# Patient Record
Sex: Male | Born: 1965 | Race: White | Hispanic: No | Marital: Single | Smoking: Never smoker
Health system: Southern US, Community
[De-identification: ages and names within clinical notes are randomized; demographics above are authoritative.]

## PROBLEM LIST (undated history)

## (undated) DIAGNOSIS — Z800 Family history of malignant neoplasm of digestive organs: Secondary | ICD-10-CM

## (undated) DIAGNOSIS — K449 Diaphragmatic hernia without obstruction or gangrene: Secondary | ICD-10-CM

## (undated) DIAGNOSIS — F419 Anxiety disorder, unspecified: Secondary | ICD-10-CM

## (undated) DIAGNOSIS — A048 Other specified bacterial intestinal infections: Secondary | ICD-10-CM

## (undated) DIAGNOSIS — Z5189 Encounter for other specified aftercare: Secondary | ICD-10-CM

## (undated) DIAGNOSIS — K589 Irritable bowel syndrome without diarrhea: Secondary | ICD-10-CM

## (undated) HISTORY — PX: FEMUR FRACTURE SURGERY: SHX633

## (undated) HISTORY — DX: Family history of malignant neoplasm of digestive organs: Z80.0

## (undated) HISTORY — PX: ESOPHAGOGASTRODUODENOSCOPY: SHX1529

## (undated) HISTORY — DX: Other specified bacterial intestinal infections: A04.8

## (undated) HISTORY — PX: WISDOM TOOTH EXTRACTION: SHX21

## (undated) HISTORY — DX: Diaphragmatic hernia without obstruction or gangrene: K44.9

## (undated) HISTORY — DX: Anxiety disorder, unspecified: F41.9

## (undated) HISTORY — DX: Encounter for other specified aftercare: Z51.89

## (undated) HISTORY — DX: Irritable bowel syndrome, unspecified: K58.9

## (undated) HISTORY — DX: Gastro-esophageal reflux disease without esophagitis: K21.9

---

## 2000-02-03 ENCOUNTER — Emergency Department (HOSPITAL_COMMUNITY): Admission: EM | Admit: 2000-02-03 | Discharge: 2000-02-03 | Payer: Self-pay | Admitting: Emergency Medicine

## 2001-03-05 ENCOUNTER — Ambulatory Visit (HOSPITAL_COMMUNITY): Admission: RE | Admit: 2001-03-05 | Discharge: 2001-03-05 | Payer: Self-pay | Admitting: Gastroenterology

## 2002-12-29 ENCOUNTER — Encounter: Payer: Self-pay | Admitting: Gastroenterology

## 2003-01-04 ENCOUNTER — Emergency Department (HOSPITAL_COMMUNITY): Admission: EM | Admit: 2003-01-04 | Discharge: 2003-01-04 | Payer: Self-pay | Admitting: Emergency Medicine

## 2003-01-07 ENCOUNTER — Encounter: Payer: Self-pay | Admitting: Internal Medicine

## 2003-01-07 ENCOUNTER — Ambulatory Visit (HOSPITAL_COMMUNITY): Admission: RE | Admit: 2003-01-07 | Discharge: 2003-01-07 | Payer: Self-pay | Admitting: Internal Medicine

## 2003-01-13 ENCOUNTER — Encounter: Payer: Self-pay | Admitting: Internal Medicine

## 2003-01-13 ENCOUNTER — Ambulatory Visit (HOSPITAL_COMMUNITY): Admission: RE | Admit: 2003-01-13 | Discharge: 2003-01-13 | Payer: Self-pay | Admitting: Internal Medicine

## 2003-03-08 DIAGNOSIS — K449 Diaphragmatic hernia without obstruction or gangrene: Secondary | ICD-10-CM | POA: Insufficient documentation

## 2003-07-21 ENCOUNTER — Encounter: Admission: RE | Admit: 2003-07-21 | Discharge: 2003-07-21 | Payer: Self-pay | Admitting: Internal Medicine

## 2003-12-25 ENCOUNTER — Encounter: Admission: RE | Admit: 2003-12-25 | Discharge: 2003-12-25 | Payer: Self-pay | Admitting: Internal Medicine

## 2004-09-22 ENCOUNTER — Ambulatory Visit: Payer: Self-pay | Admitting: Gastroenterology

## 2004-10-10 ENCOUNTER — Ambulatory Visit: Payer: Self-pay | Admitting: Gastroenterology

## 2005-04-24 ENCOUNTER — Ambulatory Visit: Payer: Self-pay | Admitting: Gastroenterology

## 2006-08-14 ENCOUNTER — Ambulatory Visit: Payer: Self-pay | Admitting: Gastroenterology

## 2006-09-12 ENCOUNTER — Ambulatory Visit: Payer: Self-pay | Admitting: Gastroenterology

## 2007-01-13 ENCOUNTER — Ambulatory Visit: Payer: Self-pay | Admitting: Gastroenterology

## 2007-01-22 ENCOUNTER — Ambulatory Visit: Payer: Self-pay | Admitting: Gastroenterology

## 2008-02-09 ENCOUNTER — Encounter: Admission: RE | Admit: 2008-02-09 | Discharge: 2008-02-09 | Payer: Self-pay | Admitting: Occupational Medicine

## 2008-04-30 ENCOUNTER — Telehealth: Payer: Self-pay | Admitting: Gastroenterology

## 2008-05-03 ENCOUNTER — Telehealth: Payer: Self-pay | Admitting: Gastroenterology

## 2008-05-04 ENCOUNTER — Ambulatory Visit: Payer: Self-pay | Admitting: Gastroenterology

## 2008-05-04 DIAGNOSIS — R110 Nausea: Secondary | ICD-10-CM | POA: Insufficient documentation

## 2008-05-04 DIAGNOSIS — K210 Gastro-esophageal reflux disease with esophagitis: Secondary | ICD-10-CM

## 2008-05-04 DIAGNOSIS — R112 Nausea with vomiting, unspecified: Secondary | ICD-10-CM

## 2008-05-04 DIAGNOSIS — R079 Chest pain, unspecified: Secondary | ICD-10-CM

## 2008-05-04 DIAGNOSIS — R1310 Dysphagia, unspecified: Secondary | ICD-10-CM | POA: Insufficient documentation

## 2008-05-04 DIAGNOSIS — Z8719 Personal history of other diseases of the digestive system: Secondary | ICD-10-CM | POA: Insufficient documentation

## 2008-05-04 DIAGNOSIS — K219 Gastro-esophageal reflux disease without esophagitis: Secondary | ICD-10-CM

## 2008-05-05 ENCOUNTER — Encounter: Payer: Self-pay | Admitting: Gastroenterology

## 2008-05-05 ENCOUNTER — Other Ambulatory Visit: Admission: RE | Admit: 2008-05-05 | Discharge: 2008-05-05 | Payer: Self-pay | Admitting: Gastroenterology

## 2008-05-05 ENCOUNTER — Ambulatory Visit: Payer: Self-pay | Admitting: Gastroenterology

## 2008-05-07 ENCOUNTER — Encounter: Payer: Self-pay | Admitting: Gastroenterology

## 2008-12-01 ENCOUNTER — Encounter: Admission: RE | Admit: 2008-12-01 | Discharge: 2008-12-01 | Payer: Self-pay | Admitting: Internal Medicine

## 2009-11-12 ENCOUNTER — Emergency Department (HOSPITAL_COMMUNITY): Admission: EM | Admit: 2009-11-12 | Discharge: 2009-11-12 | Payer: Self-pay | Admitting: Emergency Medicine

## 2009-12-14 ENCOUNTER — Encounter: Admission: RE | Admit: 2009-12-14 | Discharge: 2009-12-14 | Payer: Self-pay | Admitting: Occupational Medicine

## 2010-02-01 ENCOUNTER — Encounter (INDEPENDENT_AMBULATORY_CARE_PROVIDER_SITE_OTHER): Payer: Self-pay | Admitting: *Deleted

## 2010-02-07 ENCOUNTER — Encounter (INDEPENDENT_AMBULATORY_CARE_PROVIDER_SITE_OTHER): Payer: Self-pay | Admitting: *Deleted

## 2010-02-08 ENCOUNTER — Encounter (INDEPENDENT_AMBULATORY_CARE_PROVIDER_SITE_OTHER): Payer: Self-pay | Admitting: *Deleted

## 2010-02-10 ENCOUNTER — Ambulatory Visit: Payer: Self-pay | Admitting: Gastroenterology

## 2010-02-19 ENCOUNTER — Telehealth: Payer: Self-pay | Admitting: Gastroenterology

## 2010-02-20 ENCOUNTER — Ambulatory Visit: Payer: Self-pay | Admitting: Gastroenterology

## 2010-02-20 ENCOUNTER — Telehealth: Payer: Self-pay | Admitting: Gastroenterology

## 2010-02-20 ENCOUNTER — Encounter (INDEPENDENT_AMBULATORY_CARE_PROVIDER_SITE_OTHER): Payer: Self-pay | Admitting: *Deleted

## 2010-02-28 ENCOUNTER — Telehealth: Payer: Self-pay | Admitting: Gastroenterology

## 2010-04-13 ENCOUNTER — Ambulatory Visit: Payer: Self-pay | Admitting: Gastroenterology

## 2010-04-14 ENCOUNTER — Telehealth: Payer: Self-pay | Admitting: Gastroenterology

## 2010-04-14 ENCOUNTER — Encounter (INDEPENDENT_AMBULATORY_CARE_PROVIDER_SITE_OTHER): Payer: Self-pay | Admitting: *Deleted

## 2010-04-14 LAB — CONVERTED CEMR LAB
AST: 28 units/L (ref 0–37)
Albumin: 3.9 g/dL (ref 3.5–5.2)
BUN: 13 mg/dL (ref 6–23)
Basophils Relative: 1 % (ref 0.0–3.0)
Bilirubin Urine: NEGATIVE
Eosinophils Relative: 2.3 % (ref 0.0–5.0)
GFR calc non Af Amer: 72.36 mL/min (ref >60)
Glucose, Bld: 82 mg/dL (ref 70–99)
HCT: 40.7 % (ref 39.0–52.0)
Hemoglobin, Urine: NEGATIVE
Ketones, ur: NEGATIVE mg/dL
Lymphs Abs: 0.9 10*3/uL (ref 0.7–4.0)
MCV: 91 fL (ref 78.0–100.0)
Monocytes Absolute: 0.3 10*3/uL (ref 0.1–1.0)
Neutrophils Relative %: 50 % (ref 43.0–77.0)
Potassium: 4.4 meq/L (ref 3.5–5.1)
RBC: 4.47 M/uL (ref 4.22–5.81)
TSH: 0.72 microintl units/mL (ref 0.35–5.50)
Total Protein, Urine: NEGATIVE mg/dL
Total Protein: 6.3 g/dL (ref 6.0–8.3)
Urine Glucose: NEGATIVE mg/dL
WBC: 2.6 10*3/uL — ABNORMAL LOW (ref 4.5–10.5)

## 2010-05-24 ENCOUNTER — Encounter: Admission: RE | Admit: 2010-05-24 | Discharge: 2010-05-24 | Payer: Self-pay | Admitting: Occupational Medicine

## 2010-05-31 ENCOUNTER — Encounter (INDEPENDENT_AMBULATORY_CARE_PROVIDER_SITE_OTHER): Payer: Self-pay | Admitting: *Deleted

## 2010-06-02 ENCOUNTER — Encounter (INDEPENDENT_AMBULATORY_CARE_PROVIDER_SITE_OTHER): Payer: Self-pay | Admitting: *Deleted

## 2010-06-08 ENCOUNTER — Encounter: Payer: Self-pay | Admitting: Sports Medicine

## 2010-06-29 ENCOUNTER — Encounter: Payer: Self-pay | Admitting: Sports Medicine

## 2010-06-29 ENCOUNTER — Ambulatory Visit
Admission: RE | Admit: 2010-06-29 | Discharge: 2010-06-29 | Payer: Self-pay | Source: Home / Self Care | Attending: Sports Medicine | Admitting: Sports Medicine

## 2010-06-29 DIAGNOSIS — M7520 Bicipital tendinitis, unspecified shoulder: Secondary | ICD-10-CM | POA: Insufficient documentation

## 2010-06-29 DIAGNOSIS — M25519 Pain in unspecified shoulder: Secondary | ICD-10-CM | POA: Insufficient documentation

## 2010-07-11 ENCOUNTER — Ambulatory Visit: Admit: 2010-07-11 | Payer: Self-pay | Admitting: Gastroenterology

## 2010-07-11 ENCOUNTER — Encounter (INDEPENDENT_AMBULATORY_CARE_PROVIDER_SITE_OTHER): Payer: Self-pay | Admitting: *Deleted

## 2010-07-13 ENCOUNTER — Ambulatory Visit: Admit: 2010-07-13 | Payer: Self-pay | Admitting: Sports Medicine

## 2010-07-18 ENCOUNTER — Ambulatory Visit
Admission: RE | Admit: 2010-07-18 | Discharge: 2010-07-18 | Payer: Self-pay | Source: Home / Self Care | Attending: Sports Medicine | Admitting: Sports Medicine

## 2010-07-25 NOTE — Procedures (Signed)
Summary: Colonoscopy   Colonoscopy  Procedure date:  01/22/2007  Findings:      Location:  [facility].   Patient Name: Nicholas Townsend, Nicholas Townsend MRN: [id number] Procedure Procedures: Colonoscopy CPT: [phone].  Personnel: Endoscopist: Vania Rea. Jarold Motto, MD.  Exam Location: Exam performed in Outpatient Clinic. Outpatient  Patient Consent: Procedure, Alternatives, Risks and Benefits discussed, consent obtained, from patient. Consent was obtained by the RN.  Indications  Increased Risk Screening: For family history of colorectal neoplasia, in  parent  History  Current Medications: Patient is not currently taking Coumadin.  Pre-Exam Physical: Performed Jan 22, 2007. Cardio-pulmonary exam, Abdominal exam, Extremity exam, Mental status exam WNL.  Comments: Pt. history reviewed/updated, physical exam performed prior to initiation of sedation? yes Exam Exam: Extent of exam reached: Cecum, extent intended: Cecum.  The cecum was identified by appendiceal orifice and IC valve. Patient position: on left side. Time to Cecum: 00:04:07. Time for Withdrawl: 00:07:17. Colon retroflexion performed. Images taken. ASA Classification: I. Tolerance: excellent.  Monitoring: Pulse and BP monitoring, Oximetry used. Supplemental O2 given. at 2 Liters.  Colon Prep Used Golytely for colon prep. Prep results: poor.  Sedation Meds: Patient assessed and found to be appropriate for moderate (conscious) sedation. Sedation was managed by the Endoscopist. Monitored Anesthesia Care. Fentanyl 100 mcg. given IV. Versed 10 mg. given IV.  Instrument(s): CF 140L. Serial [id number].  Findings - NORMAL EXAM: Cecum to Rectum. Not Seen: Polyps. AVM's. Colitis. Tumors. Melanosis. Crohn's. Diverticulosis. Hemorrhoids. Comments: VERY POOR PREP...incomplete exam...   Assessment Normal examination.  Comments: Very poor prep....repeat exam 3 years... Events  Unplanned Interventions: No  intervention was required.  Plans Medication Plan: Referring provider to order medications.  Disposition: After procedure patient sent to recovery. After recovery patient sent home.  Scheduling/Referral: Follow-Up prn.    CC: Rosalyn Gess. Norins, MD  This report was created from the original endoscopy report, which was reviewed and signed by the above listed endoscopist.

## 2010-07-25 NOTE — Progress Notes (Signed)
Summary: Prep made him sick  Phone Note Call from Patient Call back at Home Phone [phone] Call back at [phone]   Caller: Patient Call For: Dr. Jarold Motto Reason for Call: Talk to Nurse Summary of Call: Pt spoke to on-call DR last night, prep made him very sick and he was not able to complete.  Procedure scheduled for 11am.  Please call hm px or cell [phone] Initial call taken by: Misty Stanley,  February 20, 2010 8:27 AM  Follow-up for Phone Call        returned pt.'s call. Pt c/o vomiting last night after trying to do movi prep . unable to keep any of prep down, called Dr. Russella Dar (Dr. on call) . Pt. took nausea pill Dr. Russella Dar perscribed and attempted to drink Mag. Citrate as Dr Russella Dar instructed him to do ,but unable to keep this down . Pt attempted to drink 2nd part of movi prep this am  but immed. started to vomit again. Dr.Patterson notified of this . Pt cancelled for today and to be set up to come in for an office visit w/ Dr. Jarold Motto to discuss other options. Pt. notified. Follow-up by: Jamie Brookes      Appended Document: Prep made him sick pt sch'ed for October 20th @ 9:30AM

## 2010-07-25 NOTE — Letter (Signed)
Summary: Colonoscopy Letter  [facility]  [address] [place], [place] [address]   Phone: [phone]  Fax: [phone]      February 01, 2010 MRN: [id number]   [facility] [place] [address] [place], [place]  [address]   Dear Nicholas Townsend,   According to your medical record, it is time for you to schedule a Colonoscopy. The American Cancer Society recommends this procedure as a method to detect early colon cancer. Patients with a family history of colon cancer, or a personal history of colon polyps or inflammatory bowel disease are at increased risk.  This letter has beeen generated based on the recommendations made at the time of your procedure. If you feel that in your particular situation this may no longer apply, please contact our office.  Please call our office at [phone] to schedule this appointment or to update your records at your earliest convenience.  Thank you for cooperating with [place] to provide you with the very best care possible.   Sincerely,   Vania Rea. Jarold Motto, M.D.  [facility] Gastroenterology Division [phone]

## 2010-07-25 NOTE — Assessment & Plan Note (Signed)
Summary: OV BEFORE COL, UNABLE TO COMPLETE PREP...AS.   History of Present Illness Visit Type: Follow-up Visit Primary GI MD: Sheryn Bison MD Faith Rogue Primary Jayde Mcallister: [facility] Chief Complaint: Patient here to discuss having a colonoscopy. He was unable to complete the Moviprep (caused vomiting, stomach cramps). Patient does have some burning abdominal pain at times. History of Present Illness:   45 year old Caucasian male with a very strong family history of colon cancer in his father at age 85. He has had several colonoscopies, recently with the scheduled for colonoscopy but was unable to complete the polyethylene glycol prep. He denies GI complaints at this time. There is some vague history of possible elevated serum creatinine related to amino acid preparations that he takes for fitness supplementation. I do not have these labs for review.   GI Review of Systems    Reports acid reflux, belching, and  bloating.      Denies abdominal pain, chest pain, dysphagia with liquids, dysphagia with solids, heartburn, loss of appetite, nausea, vomiting, vomiting blood, weight loss, and  weight gain.        Denies anal fissure, black tarry stools, change in bowel habit, constipation, diarrhea, diverticulosis, fecal incontinence, heme positive stool, hemorrhoids, irritable bowel syndrome, jaundice, light color stool, liver problems, rectal bleeding, and  rectal pain. Preventive Screening-Counseling & Management  Caffeine-Diet-Exercise     Does Patient Exercise: yes      Drug Use:  no.      Current Medications (verified): 1)  Aciphex 20 Mg Tbec (Rabeprazole Sodium) .... Two Times A Day (Out of Medication!) 2)  Fish Oil 1000 Mg Caps (Omega-3 Fatty Acids) .... Take 2 Capsules By Mouth Once Daily 3)  Vitamin C 1000 Mg Tabs (Ascorbic Acid) .... Take 2 Tablets By Mouth Once Daily 4)  Eql Vitamin C 500 Mg Tabs (Ascorbic Acid) .... Take 1 Tablet By Mouth Once A Day 5)  Cla 2000 Mg  Tablet .... Take 1 Tablet By Mouth Once A Day 6)  Elglucomen 2000 Tablet .... Take 1 Tablet By Mouth Once A Day 7)  Ativan 0.5 Mg Tabs (Lorazepam) .... Take 1 Tablet By Mouth At Bedtime As Needed  Allergies: 1)  Codeine Phosphate (Codeine Phosphate)  Past History:  Past medical, surgical, family and social histories (including risk factors) reviewed for relevance to current acute and chronic problems.  Past Medical History: Reviewed history from 05/04/2008 and no changes required. Current Problems:  CARCINOMA, COLON, FAMILY HX (ICD-V16.0) IRRITABLE BOWEL SYNDROME, HX OF (ICD-V12.79) GERD (ICD-530.81) HIATAL HERNIA (ICD-553.3)  Past Surgical History: Fractured rt femur/ surgery Right elbow surgery  Family History: Reviewed history from 05/04/2008 and no changes required. Family History of Breast Cancer:Sister Family History of Colon Cancer: Father Family History of Pancreatic Cancer: Father  Family History of Diabetes: 3 uncles  Social History: Reviewed history from 05/04/2008 and no changes required. 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  Alcohol Use - yes-social  Illicit Drug Use - no Patient gets regular exercise. Drug Use:  no Does Patient Exercise:  yes  Review of Systems       The patient complains of back pain and sleeping problems.  The patient denies allergy/sinus, anemia, anxiety-new, arthritis/joint pain, blood in urine, breast changes/lumps, change in vision, confusion, cough, coughing up blood, depression-new, fainting, fatigue, fever, headaches-new, hearing problems, heart murmur, itching, menstrual pain, muscle pains/cramps, night sweats, nosebleeds, pregnancy symptoms, shortness of breath, skin rash, sore throat, swelling of feet/legs, swollen lymph glands, thirst - excessive ,  urination - excessive , urination changes/pain, urine leakage, vision changes, and voice change.    Vital Signs:  Patient profile:   45 year old male Height:      69  inches Weight:      168.38 pounds BMI:     24.96 BSA:     1.92 Pulse rate:   64 / minute Pulse rhythm:   regular BP sitting:   124 / 76  (right arm)  Vitals Entered By: Lamona Curl CMA Duncan Dull) (April 13, 2010 9:32 AM)  Physical Exam  General:  Well developed, well nourished, no acute distress.healthy appearing.   Head:  Normocephalic and atraumatic. Eyes:  PERRLA, no icterus.exam deferred to patient's ophthalmologist.   Psych:  Alert and cooperative. Normal mood and affect.   Impression & Recommendations:  Problem # 1:  CARCINOMA, COLON, FAMILY HX (ICD-V16.0) Assessment Unchanged  We Will Check labs including serum creatinine, also urinalysis. These are unremarkable I think he could do the Phospho-Soda pill bowel prep without any significant risk for his lack of other comorbid medical problems and his young age. Advice will be given to him pending these evaluations. I do think is important that he continue with close colonoscopy followup. He denies upper GI or other gastrointestinal symptomatology at this time. 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  Orders: TLB-Udip w/ Micro (81001-URINE) TLB-CBC Platelet - w/Differential (85025-CBCD) TLB-BMP (Basic Metabolic Panel-BMET) (80048-METABOL) TLB-Hepatic/Liver Function Pnl (80076-HEPATIC) TLB-TSH (Thyroid Stimulating Hormone) (13086-VHQ)  Patient Instructions: 1)  Copy sent to : [facility] 2)  Please continue current medications.  3)  Please go to the basement today for your labs.  4)  We will call you when your labs come back to advise if you can take the OsmoPrep. 5)  The medication list was reviewed and reconciled.  All changed / newly prescribed medications were explained.  A complete medication list was provided to the patient / caregiver. Prescriptions: ACIPHEX 20 MG TBEC (RABEPRAZOLE SODIUM) two times a day  #60 x 3   Entered by:   Harlow Mares CMA (AAMA)   Authorized by:   Mardella Layman MD [facility]   Signed by:   Harlow Mares CMA (AAMA) on 04/13/2010   Method used:   [facility] to        [facility] [phone]* (retail)       [address]       [place], [place]  [address]       Ph: [phone] or [phone]       Fax: [phone]   RxID:   [id number]

## 2010-07-25 NOTE — Progress Notes (Signed)
----   Converted from flag ---- ---- 02/20/2010 9:20 AM, Alden Hipp wrote: Per Dr Jarold Motto, charge pt for cancelled appt. ------------------------------  Phone Note Outgoing Call   Summary of Call: Patient BILLED. Leanor Kail [facility]  February 28, 2010 2:09 PM

## 2010-07-25 NOTE — Miscellaneous (Signed)
Summary: previsit prep/rm  Clinical Lists Changes  Medications: Added new medication of MOVIPREP 100 GM  SOLR (PEG-KCL-NACL-NASULF-NA ASC-C) As per prep instructions. - Signed Rx of MOVIPREP 100 GM  SOLR (PEG-KCL-NACL-NASULF-NA ASC-C) As per prep instructions.;  #1 x 0;  Signed;  Entered by: Sherren Kerns RN;  Authorized by: Mardella Layman MD [facility];  Method used: [facility] [phone]*, [address] [place], [place], [place]  [address], Ph: [phone] or [phone], Fax: [phone] Observations: Added new observation of ALLERGY REV: Done (02/10/2010 10:49)    Prescriptions: MOVIPREP 100 GM  SOLR (PEG-KCL-NACL-NASULF-NA ASC-C) As per prep instructions.  #1 x 0   Entered by:   Sherren Kerns RN   Authorized by:   Mardella Layman MD [facility]   Signed by:   Sherren Kerns RN on 02/10/2010   Method used:   Electronically to        [facility] [phone]* (retail)       [address]       [place], [place]  [address]       Ph: [phone] or [phone]       Fax: [phone]   RxID:   [id number]

## 2010-07-25 NOTE — Letter (Signed)
Summary: Pre Visit No Show Letter  [facility]  [address] [place], [place] [address]   Phone: [phone]  Fax: [phone]        May 31, 2010 MRN: [id number]    [facility] [place] [address] [place], [place]  [address]    Dear Mr. Coughlin,   We have been unable to reach you by phone concerning the pre-procedure visit that you missed on Monday 05/29/2010. For this reason,your procedure scheduled on Monday 06/12/2010 has been cancelled. Our scheduling staff will gladly assist you with rescheduling your appointments at a more convenient time. Please call our office at [phone] between the hours of 8:00am and 5:00pm, press option #2 to reach an appointment scheduler. Please consider updating your contact numbers at this time so that we can reach you by phone in the future with schedule changes or results.    Thank you,    Ezra Sites RN [facility]

## 2010-07-25 NOTE — Progress Notes (Signed)
Summary: results request  Phone Note Call from Patient Call back at [phone]   Caller: Patient Call For: Dr. Jarold Motto Reason for Call: Talk to Nurse Summary of Call: would like labwork results... this he may have been called yesterday afternoon with results, but no message left Initial call taken by: Vallarie Mare,  April 14, 2010 9:17 AM  Follow-up for Phone Call        pt aware ok to do osmo prep but he will have to sign the consent. Neysa Bonito will schedule him . Follow-up by: Harlow Mares CMA Duncan Dull),  April 14, 2010 9:25 AM

## 2010-07-25 NOTE — Letter (Signed)
Summary: New Patient letter  [facility]  [address] [place], [place] [address]   Phone: [phone]  Fax: [phone]       02/20/2010 MRN: [id number]  [facility] Nicholas Townsend [address] [place], [place]  [address]  Dear Nicholas Townsend,  Welcome to the Gastroenterology Division at [facility].    You are scheduled to see Dr. Jarold Motto on 04/13/2010 at 9:30AM on the 3rd floor at [facility], [address].  We ask that you try to arrive at our office 15 minutes prior to your appointment time to allow for check-in.  We would like you to complete the enclosed self-administered evaluation form prior to your visit and bring it with you on the day of your appointment.  We will review it with you.  Also, please bring a complete list of all your medications or, if you prefer, bring the medication bottles and we will list them.  Please bring your insurance card so that we may make a copy of it.  If your insurance requires a referral to see a specialist, please bring your referral form from your primary care physician.  Co-payments are due at the time of your visit and may be paid by cash, check or credit card.     Your office visit will consist of a consult with your physician (includes a physical exam), any laboratory testing he/she may order, scheduling of any necessary diagnostic testing (e.g. x-ray, ultrasound, CT-scan), and scheduling of a procedure (e.g. Endoscopy, Colonoscopy) if required.  Please allow enough time on your schedule to allow for any/all of these possibilities.    If you cannot keep your appointment, please call [phone] to cancel or reschedule prior to your appointment date.  This allows [place] the opportunity to schedule an appointment for another patient in need of care.  If you do not cancel or reschedule by 5 p.m. the business day prior to your appointment date, you will be charged a $50.00 late cancellation/no-show fee.    Thank you for  choosing [facility] for your medical needs.  We appreciate the opportunity to care for you.  Please visit [place] at our website  to learn more about our practice.                     Sincerely,                                                             The Gastroenterology Division

## 2010-07-25 NOTE — Letter (Signed)
Summary: Pre Visit Letter Revised  [facility]  [address] [place], [place] [address]   Phone: [phone]  Fax: [phone]        06/02/2010 MRN: [id number]   [facility] [place] [address] [place], [place]  [address]   Procedure Date:  July 24, 2010  Welcome to the Gastroenterology Division at [facility].    You are scheduled to see a nurse for your pre-procedure visit on 07/11/10 at 9:00 A.M. on the 3rd floor at [facility], [address].  We ask that you try to arrive at our office 15 minutes prior to your appointment time to allow for check-in.  Please take a minute to review the attached form.  If you answer "Yes" to one or more of the questions on the first page, we ask that you call the person listed at your earliest opportunity.  If you answer "No" to all of the questions, please complete the rest of the form and bring it to your appointment.    Your nurse visit will consist of discussing your medical and surgical history, your immediate family medical history, and your medications.   If you are unable to list all of your medications on the form, please bring the medication bottles to your appointment and we will list them.  We will need to be aware of both prescribed and over the counter drugs.  We will need to know exact dosage information as well.    Please be prepared to read and sign documents such as consent forms, a financial agreement, and acknowledgement forms.  If necessary, and with your consent, a friend or relative is welcome to sit-in on the nurse visit with you.  Please bring your insurance card so that we may make a copy of it.  If your insurance requires a referral to see a specialist, please bring your referral form from your primary care physician.  No co-pay is required for this nurse visit.     If you cannot keep your appointment, please call [phone] to cancel or reschedule prior to your appointment date.  This  allows [place] the opportunity to schedule an appointment for another patient in need of care.    Thank you for choosing [facility] for your medical needs.  We appreciate the opportunity to care for you.  Please visit [place] at our website  to learn more about our practice.  Sincerely, The Gastroenterology Division

## 2010-07-25 NOTE — Letter (Signed)
Summary: Pre Visit Letter Revised  [facility]  [address] [place], [place] [address]   Phone: [phone]  Fax: [phone]    02/07/2010 MRN: [id number]  [facility] [place] [address] [place], [place]  [address]              Procedure Date: 02/20/2010    Welcome to the Gastroenterology Division at [facility].    You are scheduled to see a nurse for your pre-procedure visit on 02/10/2010 at 11:00AM on the 3rd floor at [facility], [address].  We ask that you try to arrive at our office 15 minutes prior to your appointment time to allow for check-in.  Please take a minute to review the attached form.  If you answer "Yes" to one or more of the questions on the first page, we ask that you call the person listed at your earliest opportunity.  If you answer "No" to all of the questions, please complete the rest of the form and bring it to your appointment.    Your nurse visit will consist of discussing your medical and surgical history, your immediate family medical history, and your medications.    If you are unable to list all of your medications on the form, please bring the medication bottles to your appointment and we will list them.  We will need to be aware of both prescribed and over the counter drugs.  We will need to know exact dosage information as well.    Please be prepared to read and sign documents such as consent forms, a financial agreement, and acknowledgement forms.  If necessary, and with your consent, a friend or relative is welcome to sit-in on the nurse visit with you.  Please bring your insurance card so that we may make a copy of it.  If your insurance requires a referral to see a specialist, please bring your referral form from your primary care physician.  No co-pay is required for this nurse visit.     If you cannot keep your appointment, please call [phone] to cancel or reschedule prior to your appointment date.  This allows  [place] the opportunity to schedule an appointment for another patient in need of care.   Thank you for choosing [facility] for your medical needs.  We appreciate the opportunity to care for you.  Please visit [place] at our website  to learn more about our practice.                     Sincerely,  The Gastroenterology Division

## 2010-07-25 NOTE — Letter (Signed)
Summary: Pre Visit Letter Revised  [facility]  [address] [place], [place] [address]   Phone: [phone]  Fax: [phone]        04/14/2010 MRN: [id number] [facility] [place] [address] [place], [place]  [address]             Procedure Date:  06-12-10   Welcome to the Gastroenterology Division at [facility].    You are scheduled to see a nurse for your pre-procedure visit on 05-29-10 at 9:00a.m. on the 3rd floor at [facility], [address].  We ask that you try to arrive at our office 15 minutes prior to your appointment time to allow for check-in.  Please take a minute to review the attached form.  If you answer "Yes" to one or more of the questions on the first page, we ask that you call the person listed at your earliest opportunity.  If you answer "No" to all of the questions, please complete the rest of the form and bring it to your appointment.    Your nurse visit will consist of discussing your medical and surgical history, your immediate family medical history, and your medications.   If you are unable to list all of your medications on the form, please bring the medication bottles to your appointment and we will list them.  We will need to be aware of both prescribed and over the counter drugs.  We will need to know exact dosage information as well.    Please be prepared to read and sign documents such as consent forms, a financial agreement, and acknowledgement forms.  If necessary, and with your consent, a friend or relative is welcome to sit-in on the nurse visit with you.  Please bring your insurance card so that we may make a copy of it.  If your insurance requires a referral to see a specialist, please bring your referral form from your primary care physician.  No co-pay is required for this nurse visit.     If you cannot keep your appointment, please call [phone] to cancel or reschedule prior to your appointment date.  This allows  [place] the opportunity to schedule an appointment for another patient in need of care.    Thank you for choosing [facility] for your medical needs.  We appreciate the opportunity to care for you.  Please visit [place] at our website  to learn more about our practice.  Sincerely, The Gastroenterology Division

## 2010-07-25 NOTE — Progress Notes (Signed)
  Phone Note Call from Patient   Caller: Patient Summary of Call: On call note @ 1720. Pt taking MoviPrep for colonoscopy tomorrow and vomited about 1/2 of 1st liter. He had the same problem when he had a bowel prep a few years ago. Has had a mild HA all day. OK to take Tylenol for HA. Advised to take metoclopramide 10mg  before repeat prep tonight and again in am. Take 2  bottles of mag citrate 45 minutes after metoclopramide and then try to complete the rest of the 1st liter of MoviPrep. If he cannot tolerate MoviPrep tonight just use 2 bottles of mag citrate in am. If he can tolerate MoviPrep after metoclopramide tonight the try the liter in am and if not successful use the mag citrate. Initial call taken by: Meryl Dare MD Clementeen Graham,  February 19, 2010 5:29 PM

## 2010-07-25 NOTE — Letter (Signed)
Summary: [facility] Instructions  [facility]  [address] [place], [place] [address]   Phone: [phone]  Fax: [phone]       Nicholas Townsend    02/01/1966    MRN: [id number]        Procedure Day /Date:  Monday 02/20/2010     Arrival Time: 10:00 am      Procedure Time: 11:00 am     Location of Procedure:                    _ x_  [facility] (4th Floor)                        PREPARATION FOR COLONOSCOPY WITH MOVIPREP   Starting 5 days prior to your procedure Wednesday 8/24 do not eat nuts, seeds, popcorn, corn, beans, peas,  salads, or any raw vegetables.  Do not take any fiber supplements (e.g. Metamucil, Citrucel, and Benefiber).  THE DAY BEFORE YOUR PROCEDURE         DATE: Sunday 8/28  1.  Drink clear liquids the entire day-NO SOLID FOOD  2.  Do not drink anything colored red or purple.  Avoid juices with pulp.  No orange juice.  3.  Drink at least 64 oz. (8 glasses) of fluid/clear liquids during the day to prevent dehydration and help the prep work efficiently.  CLEAR LIQUIDS INCLUDE: Water Jello Ice Popsicles Tea (sugar ok, no milk/cream) Powdered fruit flavored drinks Coffee (sugar ok, no milk/cream) Gatorade Juice: apple, white grape, white cranberry  Lemonade Clear bullion, consomm, broth Carbonated beverages (any kind) Strained chicken noodle soup Hard Candy                             4.  In the morning, mix first dose of MoviPrep solution:    Empty 1 Pouch A and 1 Pouch B into the disposable container    Add lukewarm drinking water to the top line of the container. Mix to dissolve    Refrigerate (mixed solution should be used within 24 hrs)  5.  Begin drinking the prep at 5:00 p.m. The MoviPrep container is divided by 4 marks.   Every 15 minutes drink the solution down to the next mark (approximately 8 oz) until the full liter is complete.   6.  Follow completed prep with 16 oz of clear liquid of your choice  (Nothing red or purple).  Continue to drink clear liquids until bedtime.  7.  Before going to bed, mix second dose of MoviPrep solution:    Empty 1 Pouch A and 1 Pouch B into the disposable container    Add lukewarm drinking water to the top line of the container. Mix to dissolve    Refrigerate  THE DAY OF YOUR PROCEDURE      DATE: Monday 8/29  Beginning at 6:00 a.m. (5 hours before procedure):         1. Every 15 minutes, drink the solution down to the next mark (approx 8 oz) until the full liter is complete.  2. Follow completed prep with 16 oz. of clear liquid of your choice.    3. You may drink clear liquids until 9:00 am (2 HOURS BEFORE PROCEDURE).   MEDICATION INSTRUCTIONS  Unless otherwise instructed, you should take regular prescription medications with a small sip of water   as early as possible the morning of  your procedure.   Additional medication instructions: n/a         OTHER INSTRUCTIONS  You will need a responsible adult at least 46 years of age to accompany you and drive you home.   This person must remain in the waiting room during your procedure.  Wear loose fitting clothing that is easily removed.  Leave jewelry and other valuables at home.  However, you may wish to bring a book to read or  an iPod/MP3 player to listen to music as you wait for your procedure to start.  Remove all body piercing jewelry and leave at home.  Total time from sign-in until discharge is approximately 2-3 hours.  You should go home directly after your procedure and rest.  You can resume normal activities the  day after your procedure.  The day of your procedure you should not:   Drive   Make legal decisions   Operate machinery   Drink alcohol   Return to work  You will receive specific instructions about eating, activities and medications before you leave.    The above instructions have been reviewed and explained to me by   Sherren Kerns RN  February 10, 2010 11:36 AM     I fully understand and can verbalize these instructions _____________________________ Date _________

## 2010-07-27 NOTE — Letter (Signed)
Summary: *Consult Note  [facility]  [address]   [place], [place] [address]   Phone: [phone]  Fax: [phone]    Re:    Nicholas Townsend DOB:    11/17/1965 Kathrine Haddock, MD Occupational Health Director [facility] 06/29/10    Dear Grayland Ormond:    Thank you for requesting that we see the above patient for consultation.  A copy of the detailed office note will be sent under separate cover, for your review.  Evaluation today is consistent with:  1)  BICEPS TENDINITIS, LEFT (ICD-726.12) 2)  SHOULDER PAIN, LEFT (ICD-719.41)  Our recommendation is for: Continued conservative care.  Note this is a tear of stabilizing ligament that holds bicipital tendon in place based on [place].  The tendon is not torn and there is no rotator cuff tear.  If not improving significantly in 2 weeks I think we should try NTG patches.  Would limit shoulder flexion and  lifting overhead for next 6 weeks.  Rescan in 6 weeks.   New Orders include:  1)  Consultation Level II [99242] 2)  [place] LIMITED [[id number]]   New Medications started today include: none   After today's visit, the patients current medications include: 1)  ACIPHEX 20 MG TBEC (RABEPRAZOLE SODIUM) two times a day 2)  FISH OIL 1000 MG CAPS (OMEGA-3 FATTY ACIDS) Take 2 capsules by mouth once daily 3)  VITAMIN C 1000 MG TABS (ASCORBIC ACID) Take 2 tablets by mouth once daily 4)  EQL VITAMIN C 500 MG TABS (ASCORBIC ACID) Take 1 tablet by mouth once a day 5)  * CLA 2000 MG TABLET Take 1 tablet by mouth once a day 6)  * ELGLUCOMEN 2000 TABLET Take 1 tablet by mouth once a day 7)  ATIVAN 0.5 MG TABS (LORAZEPAM) Take 1 tablet by mouth at bedtime as needed   Thank you for this consultation.  If you have any further questions regarding the care of this patient, please do not hesitate to contact me @ [phone].  Thank you for this opportunity to look after your patient.  Sincerely,  Army Melia Ailana Cuadrado  MD  Appended Document: Orders Update    Clinical Lists Changes  Medications: Added new medication of TRAMADOL HCL 50 MG TABS (TRAMADOL HCL) 1 by mouth q6hours as needed pain - Signed Rx of TRAMADOL HCL 50 MG TABS (TRAMADOL HCL) 1 by mouth q6hours as needed pain;  #50 x 1;  Signed;  Entered by: Enid Baas MD;  Authorized by: Enid Baas MD;  Method used: Electronically to [facility] [address], [place], [place], [place]  [address], Ph: [phone], Fax: [phone]  Having increasing shoulder pain after exam yesterday.  discussed trial of tramadol.  no codeine allergy - just gets nausea so will try this.  Prescriptions: TRAMADOL HCL 50 MG TABS (TRAMADOL HCL) 1 by mouth q6hours as needed pain  #50 x 1   Entered and Authorized by:   Enid Baas MD   Signed by:   Enid Baas MD on 06/30/2010   Method used:   Electronically to        [facility] (retail)       [address]       [place], [place]  [address]       Ph: [phone]       Fax: [phone]   RxID:  [id number]   

## 2010-07-27 NOTE — Assessment & Plan Note (Signed)
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  Alcohol-Tobacco     Smoking Status: never  Allergies: 1)  Codeine Phosphate (Codeine Phosphate)  Physical Exam  General:  Well-developed,well-nourished,in no acute distress; alert,appropriate and cooperative throughout examination Msk:  muscular with excellent definition of shoulders bilat  + speeds + yergason's Neg Neers, empty can and Hawkings strong on supraspinatus , infraspinatus testing  mildy weak on IR testing However push off test very weak on left 2/2 pain  Labral test + for ant pain/ w augmentation pain resolves Additional Exam:  MSK [place] Increased fluid in bicipital groove BT appears intact not dislocated from groove However superior grrove shoes tear of stabilizing ligament  Supraspinatus, infraspinatus intact -no impingement seen bursa normal subscapularis intact - no internal impingement  POst labrum is intact Ant labrum  is irregular but difficult to visualize by US   Impression & Recommendations:  Problem # 1:  SHOULDER PAIN, LEFT (ICD-719.41)  This really seems primarily related to bicipital tendon  difficult to assess ant labrum but if not improving labral tear needs to be ruled out  no RC tear seen  Orders: [place] LIMITED ([id number])  Problem # 2:  BICEPS TENDINITIS, LEFT (ICD-726.12)  Not  much tendon swelling noted However stabilizing ligament is torn and I think this leads to bicipital subluxation with lifting  cont to avoid any lifting that creates pain if not making steady progress in 2 weeks consider adding NTG  may take an additional 6 wks to heal conservatively would rescan at that time  Orders: [place] LIMITED ([id number])  Complete Medication List: 1)  Aciphex 20 Mg Tbec (Rabeprazole sodium) .... Two times a day 2)  Fish Oil 1000 Mg Caps (Omega-3 fatty acids) .... Take 2 capsules by mouth once daily 3)  Vitamin C 1000 Mg Tabs (Ascorbic acid) .... Take 2 tablets by mouth once daily 4)  Eql Vitamin C 500 Mg Tabs (Ascorbic acid) .... Take 1 tablet by mouth once a day 5)  Cla 2000 Mg Tablet  .... Take 1 tablet by mouth once a day 6)  Elglucomen 2000 Tablet  .... Take 1 tablet by mouth once a day 7)  Ativan 0.5 Mg Tabs (Lorazepam) .... Take 1 tablet by mouth at bedtime as needed   Orders Added: 1)  Consultation Level II [99242] 2)  [place] LIMITED [[id number]]

## 2010-07-27 NOTE — Assessment & Plan Note (Signed)
Summary: W/C L SHOULDER ,MC   Vital Signs:  Patient profile:   45 year old male Height:      69 inches Weight:      172 pounds Pulse rate:   64 / minute BP sitting:   124 / 75  (right arm)  Vitals Entered By: Enid Baas MD (July 18, 2010 9:23 AM) CC: f/u L shoulder   [facility] Provider:  [facility]  CC:  f/u L shoulder.  History of Present Illness: Pt here to f/u W/C L shoulder pain, bicep tendonitis.  Less tenderness now.  Still hurt with opening doors, and with lifting motion.  Has not been doing any lifting that stresses biceps  on last visit had some tearing of retinaculum over bicepts tendon on [place] but no RC tear noted  while improved he would like to see if he can make faster progress  Preventive Screening-Counseling & Management  Alcohol-Tobacco     Smoking Status: never  Allergies: 1)  Codeine Phosphate (Codeine Phosphate)  Physical Exam  General:  Well-developed,well-nourished,in no acute distress; alert,appropriate and cooperative throughout examination Msk:  Speeds strong, slightly tender on Lt Mild tenderness on yergason's on Lt Strong on abduction, slight tenderness lt  Positive hawkins on lt Positive empty can on lt Stronger push off on lt than previously ER good on lt IR good on lt Labral test on lt painful in ER when lying down, stablizing anterior capsule prevents pain    Impression & Recommendations:  Problem # 1:  SHOULDER PAIN, LEFT (ICD-719.41)  His updated medication list for this problem includes:    Tramadol Hcl 50 Mg Tabs (Tramadol hcl) .Marland Kitchen... 1 by mouth q6hours as needed pain  still some concern for anterior labrum injury as this is still painful on clunk test will need to follow this but exam is more impressive for RC impingement today and biceps injury  give trial of NTG patches reck 4 weeks  Problem # 2:  BICEPS TENDINITIS, LEFT (ICD-726.12) improved in some aspects However more impingement signs  start some  light exercise w 3 lb dumbell  avoid any heavy lifitng on left  Complete Medication List: 1)  Aciphex 20 Mg Tbec (Rabeprazole sodium) .... Two times a day 2)  Fish Oil 1000 Mg Caps (Omega-3 fatty acids) .... Take 2 capsules by mouth once daily 3)  Vitamin C 1000 Mg Tabs (Ascorbic acid) .... Take 2 tablets by mouth once daily 4)  Eql Vitamin C 500 Mg Tabs (Ascorbic acid) .... Take 1 tablet by mouth once a day 5)  Cla 2000 Mg Tablet  .... Take 1 tablet by mouth once a day 6)  Elglucomen 2000 Tablet  .... Take 1 tablet by mouth once a day 7)  Ativan 0.5 Mg Tabs (Lorazepam) .... Take 1 tablet by mouth at bedtime as needed 8)  Tramadol Hcl 50 Mg Tabs (Tramadol hcl) .Marland Kitchen.. 1 by mouth q6hours as needed pain 9)  Nitroglycerin 0.1 Mg/hr Pt24 (Nitroglycerin) .... Apply 1/2 patch to affected area daily.  change patch every day. Prescriptions: NITROGLYCERIN 0.1 MG/HR PT24 (NITROGLYCERIN) Apply 1/2 patch to affected area daily.  Change patch every day.  #30 x 1   Entered and Authorized by:   Enid Baas MD   Signed by:   Enid Baas MD on 07/18/2010   Method used:   Electronically to        [facility] [address] [phone][facility] (retail)       [address]  [place], [place]  [address]       Ph: [phone] or [phone]       Fax: [phone]   RxID:   [id number] NITROGLYCERIN 0.1 MG/HR PT24 (NITROGLYCERIN) Apply 1/2 patch to affected area daily.  Change patch every day.  #30 x 1   Entered and Authorized by:   Enid Baas MD   Signed by:   Enid Baas MD on 07/18/2010   Method used:   [facility]        [facility] [phone][facility] (retail)       [address]       [place], [place]  [address]       Ph: [phone] or [phone]       Fax: [phone]   RxID:   [id number]    Orders Added: 1)  Est. Patient Level III [70623]

## 2010-07-27 NOTE — Letter (Signed)
Summary: Pre Visit No Show Letter  [facility]  [address] [place], [place] [address]   Phone: [phone]  Fax: [phone]        July 11, 2010 MRN: [id number]    [facility] [place] [address] [place], [place]  [address]    Dear Nicholas Townsend,   We have been unable to reach you by phone concerning the pre-procedure visit that you missed on  Tuesday 07/11/2010. For this reason,your procedure scheduled on Monday 1/30 has been cancelled. Our scheduling staff will gladly assist you with rescheduling your appointments at a more convenient time. Please call our office at [phone] between the hours of 8:00am and 5:00pm, press option #2 to reach an appointment scheduler. Please consider updating your contact numbers at this time so that we can reach you by phone in the future with schedule changes or results.    Thank you,    Ezra Sites RN [facility]

## 2010-07-27 NOTE — Consult Note (Signed)
Summary: [facility]- Medical services  [facility]- Medical services   Imported By: Marily Memos 06/08/2010 12:11:28  _____________________________________________________________________  External Attachment:    Type:   Image     Comment:   External Document

## 2010-08-15 ENCOUNTER — Ambulatory Visit: Payer: Self-pay | Admitting: Sports Medicine

## 2010-09-05 ENCOUNTER — Encounter: Payer: Self-pay | Admitting: Sports Medicine

## 2010-09-05 ENCOUNTER — Ambulatory Visit (INDEPENDENT_AMBULATORY_CARE_PROVIDER_SITE_OTHER): Payer: Worker's Compensation | Admitting: Sports Medicine

## 2010-09-05 DIAGNOSIS — M25519 Pain in unspecified shoulder: Secondary | ICD-10-CM

## 2010-09-05 DIAGNOSIS — M7520 Bicipital tendinitis, unspecified shoulder: Secondary | ICD-10-CM

## 2010-09-12 NOTE — Assessment & Plan Note (Signed)
Summary: W/C SHOULDER,MC   Vital Signs:  Patient profile:   45 year old male Pulse rate:   72 / minute BP sitting:   127 / 68  (left arm)  Vitals Entered By: Rochele Pages RN (September 05, 2010 11:18 AM) CC: L shoulder continued pain   [facility] Provider[facility]  CC:  L shoulder continued pain.  History of Present Illness: Nicholas Townsend returns for followup of his left shoulder pain found to have bicipital tendonopathy on last exam note had tear of stabilizing lig over BT on [place]  this continues to be less painful in fact no pain at all with 5 lb dumbbell exercises moved back to body building with bar  most exercises pain free however he tried "preacher curls" with arm extended and full bar weight this caused return of pain in ant left shoulder  no night pain no weakness on ADLs  Preventive Screening-Counseling & Management  Alcohol-Tobacco     Smoking Status: never  Allergies: 1)  Codeine Phosphate (Codeine Phosphate)  Physical Exam  General:  Well-developed,well-nourished,in no acute distress; alert,appropriate and cooperative throughout examination Msk:  muscular shoulders on left shoulder any teat that stresses the ant shoulder is painful most pain w speeds and yergasons norm RC strength except if I place in empty can or hawkins positions he gets pain over BT  Full ROM OBrien's test neg Jobe relocation test neg neg clunk Additional Exam:  MSK [place] left shoulder rescanned compared to last exam - much less fluid over Biceps tendon and the restraining ligament seems in place now long view looks normal and tendon appears intact Supraspinatus, infraspinatus and subscap are all normal without tears AC joint with some mild degen change only no bursal swelling or sings of impingement   Impression & Recommendations:  Problem # 1:  SHOULDER PAIN, LEFT (ICD-719.41) Assessment Improved  much less than before  some recurrence since he tried to up weight and  doing some higher risk exercises  can use OTC NSAIDs and he is using argininine w good relief too many headaches w NTG  Orders: [place] LIMITED ([id number])  Problem # 2:  BICEPS TENDINITIS, LEFT (ICD-726.12)  This appears healing by scan  however, I don't think he should push any exercise that hurts the bicipital groove until this has plenty of time to stabilize supporting ligament for BT appears to be healing  OK to lift ad lib but avoid all exercises that seem to stress BT/  keep weight low to moderate  reck 6 wks if not resolveed  Orders: [place] LIMITED ([id number])  Complete Medication List: 1)  Aciphex 20 Mg Tbec (Rabeprazole sodium) .... Two times a day 2)  Fish Oil 1000 Mg Caps (Omega-3 fatty acids) .... Take 2 capsules by mouth once daily 3)  Vitamin C 1000 Mg Tabs (Ascorbic acid) .... Take 2 tablets by mouth once daily 4)  Eql Vitamin C 500 Mg Tabs (Ascorbic acid) .... Take 1 tablet by mouth once a day 5)  Cla 2000 Mg Tablet  .... Take 1 tablet by mouth once a day 6)  Elglucomen 2000 Tablet  .... Take 1 tablet by mouth once a day 7)  Ativan 0.5 Mg Tabs (Lorazepam) .... Take 1 tablet by mouth at bedtime as needed 8)  Nitroglycerin 0.1 Mg/hr Pt24 (Nitroglycerin) .... Apply 1/2 patch to affected area daily.  change patch every day.   Orders Added: 1)  Est. Patient Level III [09811] 2)  [place] LIMITED [[id number]]

## 2010-10-19 ENCOUNTER — Ambulatory Visit: Payer: Self-pay | Admitting: Sports Medicine

## 2010-11-07 ENCOUNTER — Ambulatory Visit: Payer: Self-pay | Admitting: Family Medicine

## 2010-11-10 NOTE — Assessment & Plan Note (Signed)
[facility]                                 ON-CALL NOTE   NAME:SHROPSHIRENori, [place]                   MRN:          [id number]  DATE:09/07/2006                            DOB:          03-Jan-1966    I was paged to call patient this morning at 6:37a.m.  I placed several  calls and was told that this was not the [place] residence.  When I  contacted the answering service to inform them that the telephone number  was incorrect they subsequently paged me back indicating that the  correct number was installed.  This was at 9:25 a.m.  I called Mr.  Locey at the proper telephone number and got an answering machine  only.  I indicated for him to call me if he is having any further  abdominal pain.     Barbette Hair. Arlyce Dice, MD,FACG  Electronically Signed    RDK/MedQ  DD: 09/07/2006  DT: 09/08/2006  Job #: [id number]

## 2010-11-10 NOTE — Assessment & Plan Note (Signed)
[facility]                         GASTROENTEROLOGY OFFICE NOTE   NAME:Bonnie, Nicholas Townsend                     MRN:          [id number]  DATE:08/14/2006                            DOB:          [birth year]    Derwin's reflux is well controlled on daily AcipHex therapy.  He really  denies any general medical or GI problems.  He does have IBS and uses  Levsin on a p.r.n. basis.  He has a history of colon cancer in his  father at age 46, and his last colonoscopy was in September of 2002.  His other medications at this time include a variety of multivitamins  and protein supplements.  He is followed clinically by Dr. Debby Bud.   He is a healthy-appearing fit white male in no acute distress.  He weighs 173 pounds, blood pressure 120/70.  Pulse was 82 and regular.  I could not appreciate stigmata of chronic liver disease.  CHEST:  Clear.  There were no murmurs, gallops, or rubs noted.  He was  in a regular rhythm.  I could not appreciate hepatosplenomegaly, abdominal masses, or  tenderness.  Bowel sounds were normal.  RECTAL:  Deferred.  MENTAL STATUS:  Normal.   ASSESSMENT:  1. Irritable bowel syndrome with a strong family history of colon      carcinoma with need for a followup regular colonoscopy screening.  2. Chronic gastroesophageal reflux controlled with maintenance proton      pump inhibitor therapy.   RECOMMENDATIONS:  1. Refill AcipHex and Levsin prescriptions.  2. Outpatient colonoscopy at his convenience.     Vania Rea. Jarold Motto, MD, Caleen Essex, FAGA  Electronically Signed    DRP/MedQ  DD: 08/14/2006  DT: 08/14/2006  Job #: [id number]   cc:   Rosalyn Gess. Norins, MD

## 2011-08-15 ENCOUNTER — Encounter: Payer: Self-pay | Admitting: *Deleted

## 2011-08-16 ENCOUNTER — Ambulatory Visit (INDEPENDENT_AMBULATORY_CARE_PROVIDER_SITE_OTHER): Payer: 59 | Admitting: Gastroenterology

## 2011-08-16 ENCOUNTER — Encounter: Payer: Self-pay | Admitting: Gastroenterology

## 2011-08-16 VITALS — BP 120/76 | HR 64 | Ht 69.0 in | Wt 166.4 lb

## 2011-08-16 DIAGNOSIS — Z800 Family history of malignant neoplasm of digestive organs: Secondary | ICD-10-CM | POA: Insufficient documentation

## 2011-08-16 DIAGNOSIS — Z8719 Personal history of other diseases of the digestive system: Secondary | ICD-10-CM

## 2011-08-16 MED ORDER — SOD PHOS MONO-SOD PHOS DIBASIC 1.102-0.398 G PO TABS: ORAL_TABLET | ORAL | Status: DC

## 2011-08-16 NOTE — Progress Notes (Signed)
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 He has a family history of colon cancer with an incomplete colonoscopy 3 years ago because of a poor prep. He denies gastrointestinal issues at this time is here to discuss alternative preparations for his colonoscopy exam. He also has a history of acid reflux, previous pill-induced injury to his esophagus, also a history of IBS. Is a family history of colon cancer in his father. He denies any other gastrointestinal or hepatobiliary complaints. His appetite is good and his weight is stable. He denies any specific food intolerances.  Current Medications, Allergies, Past Medical History, Past Surgical History, Family History and Social History were reviewed in [employer] record.  Pertinent Review of Systems Negative   Physical Exam: Awake and alert in no acute distress. I cannot appreciate stigmata of chronic liver disease. Chest is clear and he appears to be in a regular rhythm without murmurs gallops or rubs. He has a board- like abdomen from sit-ups, but I cannot appreciate organomegaly, masses or tenderness. Bowel sounds are normal. Mental status is normal her peripheral extremities are unremarkable.    Assessment and Plan: Followup colonoscopy with OSMO pill Phospho-Soda colonic stimulation standard preparation. He otherwise is doing well, we will see him as needed. No diagnosis found.

## 2011-08-16 NOTE — Patient Instructions (Signed)
Your procedure has been scheduled for 09/17/2011, please follow the seperate instructions.  Your prescription(s) have been sent to you pharmacy.  You signed the consent for the use of Osmoprep.

## 2011-09-17 ENCOUNTER — Encounter: Payer: Self-pay | Admitting: Gastroenterology

## 2011-10-29 ENCOUNTER — Telehealth: Payer: Self-pay | Admitting: Gastroenterology

## 2011-10-29 ENCOUNTER — Encounter: Payer: 59 | Admitting: Gastroenterology

## 2011-10-29 NOTE — Telephone Encounter (Signed)
Called patient per Dr Jarold Motto patient will be billed for no show today, left voicemail to call back and ask for me.

## 2011-10-30 NOTE — Telephone Encounter (Signed)
No return call back from pt, please bill pt for no show per Dr Jarold Motto

## 2013-08-21 ENCOUNTER — Encounter: Payer: Self-pay | Admitting: Internal Medicine

## 2013-09-21 ENCOUNTER — Encounter: Payer: Self-pay | Admitting: Internal Medicine

## 2013-09-24 ENCOUNTER — Ambulatory Visit: Payer: Self-pay | Admitting: Internal Medicine

## 2014-08-25 ENCOUNTER — Telehealth: Payer: Self-pay | Admitting: Gastroenterology

## 2014-08-25 NOTE — Telephone Encounter (Signed)
Patient given pathology results as per letter from 04/2008 that was mailed to him. He also wants to schedule OV to discuss colonoscopy. He states he has to have the pills not the liquid prep. Scheduled with Nicoletta Ba, PA on 09/13/14 at 3:00 PM.

## 2014-09-13 ENCOUNTER — Ambulatory Visit: Payer: Self-pay | Admitting: Physician Assistant

## 2014-10-20 ENCOUNTER — Encounter: Payer: Self-pay | Admitting: Gastroenterology

## 2014-10-28 ENCOUNTER — Ambulatory Visit: Payer: Self-pay | Admitting: Physician Assistant

## 2015-05-17 ENCOUNTER — Other Ambulatory Visit: Payer: Self-pay | Admitting: Nurse Practitioner

## 2015-05-17 ENCOUNTER — Ambulatory Visit
Admission: RE | Admit: 2015-05-17 | Discharge: 2015-05-17 | Disposition: A | Discharge status: Home / Self Care | Payer: Worker's Compensation | Source: Ambulatory Visit | Attending: Nurse Practitioner | Admitting: Nurse Practitioner

## 2015-05-17 DIAGNOSIS — R609 Edema, unspecified: Secondary | ICD-10-CM

## 2015-05-17 DIAGNOSIS — T1490XA Injury, unspecified, initial encounter: Secondary | ICD-10-CM

## 2015-05-17 DIAGNOSIS — R52 Pain, unspecified: Secondary | ICD-10-CM

## 2015-05-24 ENCOUNTER — Encounter: Payer: Self-pay | Admitting: Internal Medicine

## 2015-07-26 ENCOUNTER — Encounter: Payer: Self-pay | Admitting: Internal Medicine

## 2015-07-26 ENCOUNTER — Ambulatory Visit (INDEPENDENT_AMBULATORY_CARE_PROVIDER_SITE_OTHER): Payer: Commercial Managed Care - HMO | Admitting: Internal Medicine

## 2015-07-26 VITALS — BP 120/62 | HR 92 | Wt 158.4 lb

## 2015-07-26 DIAGNOSIS — Z1211 Encounter for screening for malignant neoplasm of colon: Secondary | ICD-10-CM

## 2015-07-26 DIAGNOSIS — Z8371 Family history of colonic polyps: Secondary | ICD-10-CM

## 2015-07-26 DIAGNOSIS — K219 Gastro-esophageal reflux disease without esophagitis: Secondary | ICD-10-CM | POA: Diagnosis not present

## 2015-07-26 MED ORDER — METOCLOPRAMIDE HCL 10 MG PO TABS: ORAL_TABLET | ORAL | Status: DC

## 2015-07-26 MED ORDER — NA SULFATE-K SULFATE-MG SULF 17.5-3.13-1.6 GM/177ML PO SOLN: ORAL | Status: DC

## 2015-07-26 NOTE — Patient Instructions (Signed)
You have been scheduled for an endoscopy and colonoscopy. Please follow the written instructions given to you at your visit today. Please pick up your prep supplies at the pharmacy within the next 1-3 days. If you use inhalers (even only as needed), please bring them with you on the day of your procedure. Your physician has requested that you go to www.startemmi.com and enter the access code given to you at your visit today. This web site gives a general overview about your procedure. However, you should still follow specific instructions given to you by our office regarding your preparation for the procedure.  We have sent the following medications to your pharmacy for you to pick up at your convenience: Reglan 10 mg-Take as directed for colonoscopy prep

## 2015-07-26 NOTE — Progress Notes (Signed)
Patient ID: Nicholas Townsend, male   DOB: 1966-03-15, 50 y.o.   MRN: [id number] HPI: Nicholas Townsend is a 50 year old male with history of GERD, IBS, family history of colon polyp requiring resection in his father in early 87s who seen to discuss colorectal cancer screening and his reflux disease. He is here alone today. He was previously seen and followed by Dr. Sharlett Iles but has not been seen in our office in nearly 4 years. He reports that he's been doing well. He has a long-standing history of GERD and was previously on AcipHex twice a day. He is now off of this medication because he was worried about "soft bones". He is now using vinegar on an as-needed basis for heartburn symptoms. In the past year he's only had 2 severe heartburn episodes requiring multiple doses of vinegar. He denies trouble swallowing though occasionally pills feel like they travel slowly into his stomach. He tries to avoid eating fast. He denies nausea, vomiting, early satiety and weight loss. No hepatobiliary complaint. Bowel movements are regular without blood in his stool or melena. No diarrhea or constipation. He does have a history of anxiety and irritable bowel at his irritable bowel seems to have smoothed out with age.  His father had a large colon mass requiring resection in his early 57s. He doesn't feel that this was yet a cancer however. He later died of pancreatic cancer.  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 Biopsies, or rather brushings, showed no malignant cells and patchy acute inflammation. Colonoscopy in July 2008 was a poor prep. He recalls this being poor because he had issues with nausea vomiting related to the prep. He later scheduled another colonoscopy with Phospho-Soda tablets but then was worried about renal failure and never had the procedure done.  Past Medical History  Diagnosis Date  . Family history of malignant neoplasm of  gastrointestinal tract   . Esophageal reflux   . Hiatal hernia   . IBS (irritable bowel syndrome)     Past Surgical History  Procedure Laterality Date  . Femur fracture surgery    . Wisdom tooth extraction      Outpatient Prescriptions Prior to Visit  Medication Sig Dispense Refill  . ALPRAZolam (XANAX) 1 MG tablet Take 1 mg by mouth at bedtime as needed.    . sodium phosphates (OSMOPREP) 1.102-0.398 G TABS Take per prep instructions 32 tablet 0   No facility-administered medications prior to visit.    Allergies  Allergen Reactions  . Codeine Phosphate     REACTION: GI upset    Family History  Problem Relation Age of Onset  . Colon cancer Father 43  . Hypertension Father   . Skin cancer Father   . Hypertension Mother   . Breast cancer Sister   . Colon cancer Paternal Uncle   . Colon cancer Cousin   . Colon cancer Paternal Grandmother     Social History  Substance Use Topics  . Smoking status: Never Smoker   . Smokeless tobacco: Never Used  . Alcohol Use: Yes     Comment: Occasionally    ROS: As per history of present illness, otherwise negative  BP 120/62 mmHg  Pulse 92  Wt 158 lb 6.4 oz (71.85 kg) Constitutional: Well-developed and well-nourished. No distress. HEENT: Normocephalic and atraumatic. Oropharynx is clear and moist. No oropharyngeal exudate. Conjunctivae are normal.  No scleral icterus. Neck: Neck supple. Trachea midline. Cardiovascular: Normal  rate, regular rhythm and intact distal pulses. No M/R/G Pulmonary/chest: Effort normal and breath sounds normal. No wheezing, rales or rhonchi. Abdominal: Soft, nontender, nondistended. Bowel sounds active throughout. There are no masses palpable. No hepatosplenomegaly. Extremities: no clubbing, cyanosis, or edema Lymphadenopathy: No cervical adenopathy noted. Neurological: Alert and oriented to person place and time. Skin: Skin is warm and dry. No rashes noted. Psychiatric: Normal mood and affect.  Behavior is normal.   ASSESSMENT/PLAN: 50 year old male with history of GERD, IBS, family history of colon polyp requiring resection in his father in early 52s who seen to discuss colorectal cancer screening and his reflux disease.  1. GERD -- long-standing history with history of pill esophagitis. He's not currently on medications but using vinegar on an as-needed basis to help control heartburn. No alarm symptoms at present. We discussed Barrett's esophagus being most prevalent in middle-aged white males. For this reason and given his history of esophagitis, I have recommended repeat upper endoscopy for Barrett's screening. For now he will treat symptoms when necessary unless heartburn worsens or Barrett's is found. We discussed the risks, benefits and alternatives to upper endoscopy and he is agreeable to proceed  2. Family history of advanced colon polyp/colon cancer screening -- I have recommended repeat: Cancer screening at this time. We discussed the risks, benefits and alternatives and he is agreeable to proceed. Hopefully he will do better with split dose preparation and I have recommended metoclopramide 10 mg 30 minutes to one hour before each half of the prep to try to avoid nausea vomiting.

## 2015-08-16 ENCOUNTER — Encounter: Payer: Self-pay | Admitting: Gastroenterology

## 2015-08-16 ENCOUNTER — Ambulatory Visit (AMBULATORY_SURGERY_CENTER): Payer: Commercial Managed Care - HMO | Admitting: Gastroenterology

## 2015-08-16 VITALS — BP 110/73 | HR 63 | Temp 97.4°F | Resp 12 | Ht 69.0 in | Wt 158.0 lb

## 2015-08-16 DIAGNOSIS — Z800 Family history of malignant neoplasm of digestive organs: Secondary | ICD-10-CM

## 2015-08-16 DIAGNOSIS — Z1211 Encounter for screening for malignant neoplasm of colon: Secondary | ICD-10-CM

## 2015-08-16 DIAGNOSIS — D123 Benign neoplasm of transverse colon: Secondary | ICD-10-CM | POA: Diagnosis not present

## 2015-08-16 DIAGNOSIS — K219 Gastro-esophageal reflux disease without esophagitis: Secondary | ICD-10-CM

## 2015-08-16 MED ORDER — SODIUM CHLORIDE 0.9 % IV SOLN: 500.0000 mL | INTRAVENOUS | Status: DC

## 2015-08-16 NOTE — Patient Instructions (Signed)
YOU HAD AN ENDOSCOPIC PROCEDURE TODAY AT THE [facility]:   Refer to the procedure report that was given to you for any specific questions about what was found during the examination.  If the procedure report does not answer your questions, please call your gastroenterologist to clarify.  If you requested that your care partner not be given the details of your procedure findings, then the procedure report has been included in a sealed envelope for you to review at your convenience later.  YOU SHOULD EXPECT: Some feelings of bloating in the abdomen. Passage of more gas than usual.  Walking can help get rid of the air that was put into your GI tract during the procedure and reduce the bloating. If you had a lower endoscopy (such as a colonoscopy or flexible sigmoidoscopy) you may notice spotting of blood in your stool or on the toilet paper. If you underwent a bowel prep for your procedure, you may not have a normal bowel movement for a few days.  Please Note:  You might notice some irritation and congestion in your nose or some drainage.  This is from the oxygen used during your procedure.  There is no need for concern and it should clear up in a day or so.  SYMPTOMS TO REPORT IMMEDIATELY:   Following lower endoscopy (colonoscopy or flexible sigmoidoscopy):  Excessive amounts of blood in the stool  Significant tenderness or worsening of abdominal pains  Swelling of the abdomen that is new, acute  Fever of 100F or higher   Following upper endoscopy (EGD)  Vomiting of blood or coffee ground material  New chest pain or pain under the shoulder blades  Painful or persistently difficult swallowing  New shortness of breath  Fever of 100F or higher  Black, tarry-looking stools  For urgent or emergent issues, a gastroenterologist can be reached at any hour by calling [phone].   DIET: Your first meal following the procedure should be a small meal and then it is ok to progress to  your normal diet. Heavy or fried foods are harder to digest and may make you feel nauseous or bloated.  Likewise, meals heavy in dairy and vegetables can increase bloating.  Drink plenty of fluids but you should avoid alcoholic beverages for 24 hours.  ACTIVITY:  You should plan to take it easy for the rest of today and you should NOT DRIVE or use heavy machinery until tomorrow (because of the sedation medicines used during the test).    FOLLOW UP: Our staff will call the number listed on your records the next business day following your procedure to check on you and address any questions or concerns that you may have regarding the information given to you following your procedure. If we do not reach you, we will leave a message.  However, if you are feeling well and you are not experiencing any problems, there is no need to return our call.  We will assume that you have returned to your regular daily activities without incident.  If any biopsies were taken you will be contacted by phone or by letter within the next 1-3 weeks.  Please call us at [phone] if you have not heard about the biopsies in 3 weeks.    SIGNATURES/CONFIDENTIALITY: You and/or your care partner have signed paperwork which will be entered into your electronic medical record.  These signatures attest to the fact that that the information above on your After Visit Summary has been reviewed   and is understood.  Full responsibility of the confidentiality of this discharge information lies with you and/or your care-partner. 

## 2015-08-16 NOTE — Progress Notes (Signed)
Called to room to assist during endoscopic procedure.  Patient ID and intended procedure confirmed with present staff. Received instructions for my participation in the procedure from the performing physician.  

## 2015-08-16 NOTE — Progress Notes (Signed)
No problems noted in the recovery room. maw 

## 2015-08-16 NOTE — Progress Notes (Signed)
  [facility] Post-op Note  Patient: Nicholas Townsend  Procedure(s) Performed: colonoscopy and endoscopy  Patient Location: [facility] - Recovery Area  Anesthesia Type: Deep Sedation/Propofol  Level of Consciousness: awake, oriented and patient cooperative  Airway and Oxygen Therapy: Patient Spontanous Breathing  Post-op Pain: none  Post-op Assessment:  Post-op Vital signs reviewed, Patient's Cardiovascular Status Stable, Respiratory Function Stable, Patent Airway, No signs of Nausea or vomiting and Pain level controlled  Post-op Vital Signs: Reviewed and stable  Complications: No apparent anesthesia complications  Warrene Kapfer E 2:34 PM

## 2015-08-17 ENCOUNTER — Telehealth: Payer: Self-pay | Admitting: Emergency Medicine

## 2015-08-17 NOTE — Telephone Encounter (Signed)
Left message, identifier present, f/u 

## 2015-08-22 ENCOUNTER — Encounter: Payer: Self-pay | Admitting: Gastroenterology

## 2015-09-23 ENCOUNTER — Encounter: Payer: Self-pay | Admitting: Internal Medicine

## 2018-08-26 ENCOUNTER — Other Ambulatory Visit: Payer: Self-pay | Admitting: Nurse Practitioner

## 2018-08-26 ENCOUNTER — Ambulatory Visit
Admission: RE | Admit: 2018-08-26 | Discharge: 2018-08-26 | Disposition: A | Discharge status: Home / Self Care | Payer: No Typology Code available for payment source | Source: Ambulatory Visit | Attending: Nurse Practitioner | Admitting: Nurse Practitioner

## 2018-08-26 DIAGNOSIS — W19XXXA Unspecified fall, initial encounter: Secondary | ICD-10-CM

## 2018-11-17 ENCOUNTER — Encounter (HOSPITAL_COMMUNITY): Payer: Self-pay | Admitting: *Deleted

## 2018-11-17 ENCOUNTER — Other Ambulatory Visit: Payer: Self-pay

## 2018-11-17 ENCOUNTER — Emergency Department (HOSPITAL_COMMUNITY): Payer: 59

## 2018-11-17 ENCOUNTER — Emergency Department (HOSPITAL_COMMUNITY)
Admission: EM | Admit: 2018-11-17 | Discharge: 2018-11-17 | Disposition: A | Discharge status: Home / Self Care | Payer: 59 | Attending: Emergency Medicine | Admitting: Emergency Medicine

## 2018-11-17 DIAGNOSIS — Y999 Unspecified external cause status: Secondary | ICD-10-CM | POA: Diagnosis not present

## 2018-11-17 DIAGNOSIS — M79604 Pain in right leg: Secondary | ICD-10-CM | POA: Insufficient documentation

## 2018-11-17 DIAGNOSIS — Y939 Activity, unspecified: Secondary | ICD-10-CM | POA: Insufficient documentation

## 2018-11-17 DIAGNOSIS — S86811A Strain of other muscle(s) and tendon(s) at lower leg level, right leg, initial encounter: Secondary | ICD-10-CM | POA: Insufficient documentation

## 2018-11-17 DIAGNOSIS — Y929 Unspecified place or not applicable: Secondary | ICD-10-CM | POA: Insufficient documentation

## 2018-11-17 DIAGNOSIS — M7989 Other specified soft tissue disorders: Secondary | ICD-10-CM | POA: Diagnosis not present

## 2018-11-17 DIAGNOSIS — S86111A Strain of other muscle(s) and tendon(s) of posterior muscle group at lower leg level, right leg, initial encounter: Secondary | ICD-10-CM

## 2018-11-17 DIAGNOSIS — X58XXXA Exposure to other specified factors, initial encounter: Secondary | ICD-10-CM | POA: Diagnosis not present

## 2018-11-17 LAB — BASIC METABOLIC PANEL
Anion gap: 7 (ref 5–15)
BUN: 23 mg/dL — ABNORMAL HIGH (ref 6–20)
CO2: 28 mmol/L (ref 22–32)
Calcium: 8.8 mg/dL — ABNORMAL LOW (ref 8.9–10.3)
Chloride: 104 mmol/L (ref 98–111)
Creatinine, Ser: 1 mg/dL (ref 0.61–1.24)
GFR calc Af Amer: >60 mL/min (ref >60)
GFR calc non Af Amer: >60 mL/min (ref >60)
Glucose, Bld: 115 mg/dL — ABNORMAL HIGH (ref 70–99)
Potassium: 4.4 mmol/L (ref 3.5–5.1)
Sodium: 139 mmol/L (ref 135–145)

## 2018-11-17 LAB — CBC
HCT: 37 % — ABNORMAL LOW (ref 39.0–52.0)
Hemoglobin: 12 g/dL — ABNORMAL LOW (ref 13.0–17.0)
MCH: 31.5 pg (ref 26.0–34.0)
MCHC: 32.4 g/dL (ref 30.0–36.0)
MCV: 97.1 fL (ref 80.0–100.0)
Platelets: 237 10*3/uL (ref 150–400)
RBC: 3.81 MIL/uL — ABNORMAL LOW (ref 4.22–5.81)
RDW: 13.7 % (ref 11.5–15.5)
WBC: 5.1 10*3/uL (ref 4.0–10.5)
nRBC: 0 % (ref 0.0–0.2)

## 2018-11-17 MED ORDER — ACETAMINOPHEN 500 MG PO TABS: 1000.0000 mg | ORAL_TABLET | Freq: Once | ORAL | Status: DC

## 2018-11-17 MED ORDER — ENOXAPARIN SODIUM 80 MG/0.8ML ~~LOC~~ SOLN: 70.0000 mg | Freq: Once | SUBCUTANEOUS | Status: DC

## 2018-11-17 MED ORDER — ENOXAPARIN SODIUM 40 MG/0.4ML ~~LOC~~ SOLN: 40.0000 mg | Freq: Once | SUBCUTANEOUS | Status: DC

## 2018-11-17 NOTE — ED Triage Notes (Signed)
Pt with right lower swelling after running on beach yesterday.

## 2018-11-17 NOTE — Discharge Instructions (Addendum)
Your ultrasound is negative for deep vein blood clots, but it does show a hematoma in the calf muscle.  This is probably consistent with a calf muscle tear.  Please keep your leg elevated above your waist when sitting, and above your heart when lying down.  Please apply ice.  Use your crutches when up and about.  Use your cam walker when up and about. Use 1000 mg of tylenol with breakfast, lunch, dinner, and at bedtime. Please call Dr. Stann Mainland tomorrow for an appointment and follow-up.

## 2018-11-17 NOTE — ED Provider Notes (Addendum)
[facility] EMERGENCY DEPARTMENT Provider Note   CSN: [id number] Arrival date & time: 11/17/18  1237    History   Chief Complaint Chief Complaint  Patient presents with  . Leg Swelling    HPI Nicholas Townsend is a 53 y.o. male with IBS and GERD presenting to the ED with right leg pain. He states he drove the beach yesterday, about 4 hours away, and once he got there he went on a run. Shortly after his run he noticed right lower leg pain, swelling and tenderness. He tried using a heating pad with minimal relief. He endorses numbness in his calf. Denies chest pain, SOB, fevers, heart palpitations, trauma or injury to his leg, or history of DVT's. He is a Conservator, museum/gallery and very active at baseline. States this has never happened before.      HPI  Past Medical History:  Diagnosis Date  . Anxiety   . Blood transfusion without reported diagnosis   . Esophageal reflux   . Family history of malignant neoplasm of gastrointestinal tract   . Hiatal hernia   . IBS (irritable bowel syndrome)     Patient Active Problem List   Diagnosis Date Noted  . Family history of malignant neoplasm of gastrointestinal tract 08/16/2011  . Hx of gastroesophageal reflux (GERD) 08/16/2011  . SHOULDER PAIN, LEFT 06/29/2010  . BICEPS TENDINITIS, LEFT 06/29/2010  . ESOPHAGITIS, REFLUX 05/04/2008  . GERD 05/04/2008  . CHEST PAIN 05/04/2008  . NAUSEA AND VOMITING 05/04/2008  . DYSPHAGIA UNSPECIFIED 05/04/2008  . IRRITABLE BOWEL SYNDROME, HX OF 05/04/2008  . HIATAL HERNIA 03/08/2003    Past Surgical History:  Procedure Laterality Date  . FEMUR FRACTURE SURGERY    . WISDOM TOOTH EXTRACTION          Home Medications    Prior to Admission medications   Medication Sig Start Date End Date Taking? Authorizing Provider  LORazepam (ATIVAN) 0.5 MG tablet Take 0.5 mg by mouth at bedtime as needed for anxiety.   Yes [provider]  Multiple Vitamin (MULTIVITAMIN WITH MINERALS) TABS tablet  Take 1 tablet by mouth daily.   Yes [provider]  LEVITRA 20 MG tablet Take 20 mg by mouth daily as needed for erectile dysfunction.  07/07/15   [provider]    Family History Family History  Problem Relation Age of Onset  . Colon cancer Father 87  . Hypertension Father   . Skin cancer Father   . Hypertension Mother   . Breast cancer Sister   . Colon cancer Paternal Uncle   . Colon cancer Cousin   . Colon cancer Paternal Grandmother     Social History Social History   Tobacco Use  . Smoking status: Never Smoker  . Smokeless tobacco: Never Used  Substance Use Topics  . Alcohol use: Yes    Alcohol/week: 0.0 standard drinks    Comment: Occasionally  . Drug use: No     Allergies   Codeine phosphate   Review of Systems Review of Systems   Physical Exam Updated Vital Signs BP 119/77 (BP Location: Right Arm)   Pulse 61   Temp 97.7 F (36.5 C) (Oral)   Resp 14   Ht 5\' 9"  (1.753 m)   Wt 72.6 kg   SpO2 100%   BMI 23.63 kg/m   Physical Exam   ED Treatments / Results  Labs (all labs ordered are listed, but only abnormal results are displayed) Labs Reviewed  CBC - Abnormal; Notable for  the following components:      Result Value   RBC 3.81 (*)    Hemoglobin 12.0 (*)    HCT 37.0 (*)    All other components within normal limits  BASIC METABOLIC PANEL - Abnormal; Notable for the following components:   Glucose, Bld 115 (*)    BUN 23 (*)    Calcium 8.8 (*)    All other components within normal limits  D-DIMER, QUANTITATIVE (NOT AT [facility])    EKG None  Radiology No results found.  Procedures Procedures (including critical care time)  Medications Ordered in ED Medications - No data to display   Initial Impression / Assessment and Plan / ED Course  I have reviewed the triage vital signs and the nursing notes.  Pertinent labs & imaging results that were available during my care of the patient were reviewed by me and considered  in my medical decision making (see chart for details).  Pt is a 53 yo male presenting with right lower leg pain and swelling. He endorses calf tenderness and a positive homan's sign. Concerning for DVT vs muscle injury. Ordered cbc, bmp and right LE ultrasound.   Ultrasound is pending. If it shows a DVT patient will need to started on anticoagulation therapy. If it is negative this is likely a musculoskeletal injury and recommended RICE.    Final Clinical Impressions(s) / ED Diagnoses   Final diagnoses:  Right leg swelling  Right leg pain    ED Discharge Orders         Ordered    US Venous Img Lower Unilateral Right  Status:  Canceled    Comments:  Needs to be first case.   11/17/18 [place], Minor Iden N, DO 11/17/18 1452    Giani Winther N, DO 11/17/18 1602    Veryl Speak, MD 11/19/18 [phone]

## 2018-11-17 NOTE — ED Notes (Signed)
Patient

## 2018-11-17 NOTE — ED Provider Notes (Signed)
HAND OFF AT SHIFT CHANGE TO CONTINUE CARE.  Patient injured the right leg while at the beach on yesterday May 26.  He had swelling of the calf and came to the emergency department for evaluation.  The patient had ultrasound done to evaluate for DVT.  There was a delay in obtaining the ultrasound test due to the fact that the ultrasound team had to be called and on a holiday.  I discussed the results with the patient.  The patient states that he is aggravated that it is taken so long to be evaluated and to have the ultrasound test done.  He says he is appreciative of the ultrasound team coming in to take care of him, but it has been a  "long day" for him.  I apologized for the delays.  I gave the patient the information that he does not have a DVT, but does have a hematoma of the gastroc muscle, probably related to a gastroc tear.  I outlined for the patient the need for ice, elevation, rest, use of a cam walker, and crutches.  The patient asked if this would be done in the next 15 minutes.  He was ready to go.  He states that he is very hungry because he did not eat before coming here and he says he has been here now for several hours.  I assured the patient that I would try to expedite these orders as quickly as possible.  Before the nursing staff could go into see the patient for the cam walker and crutches, the patient walked out of the room and walk down the hall.  I discussed again with the patient the need for the devices that were being used.  The patient states that he has a boot at home and crutches.  He says he just cannot wait any longer and that he is going to leave.  The patient has been advised to see Dr. Jinny Sanders as soon as possible.  The patient was invited to return to the emergency department if any changes in his condition, problems, or concerns.  Dx: Gastrocnemius strain/tear.   Lily Kocher, PA-C 11/17/18 B[place], [place], DO 11/21/18 [phone]

## 2018-11-17 NOTE — ED Notes (Signed)
Pt left without written d/c instructions.  Was upset he was having to wait so long for them.

## 2020-10-05 ENCOUNTER — Encounter: Payer: Self-pay | Admitting: Gastroenterology

## 2020-11-16 ENCOUNTER — Ambulatory Visit: Payer: 59

## 2020-11-16 ENCOUNTER — Other Ambulatory Visit: Payer: Self-pay

## 2020-11-16 VITALS — Ht 69.0 in | Wt 167.0 lb

## 2020-11-16 DIAGNOSIS — Z86010 Personal history of colonic polyps: Secondary | ICD-10-CM

## 2020-11-16 MED ORDER — NA SULFATE-K SULFATE-MG SULF 17.5-3.13-1.6 GM/177ML PO SOLN: 1.0000 | Freq: Once | ORAL | 0 refills | Status: AC

## 2020-11-16 NOTE — Progress Notes (Signed)
No allergies to soy or egg Pt is not on blood thinners or diet pills Denies issues with sedation/intubation Denies atrial flutter/fib Denies constipation   Emmi instructions given to pt  Pt is aware of Covid safety and care partner requirements.  

## 2020-11-23 ENCOUNTER — Encounter: Payer: Self-pay | Admitting: Gastroenterology

## 2020-11-30 ENCOUNTER — Other Ambulatory Visit: Payer: Self-pay

## 2020-11-30 ENCOUNTER — Encounter: Payer: Self-pay | Admitting: Gastroenterology

## 2020-11-30 ENCOUNTER — Ambulatory Visit (AMBULATORY_SURGERY_CENTER): Payer: 59 | Admitting: Gastroenterology

## 2020-11-30 VITALS — BP 121/72 | HR 58 | Temp 97.5°F | Resp 15 | Ht 69.0 in | Wt 167.0 lb

## 2020-11-30 DIAGNOSIS — Z86010 Personal history of colonic polyps: Secondary | ICD-10-CM

## 2020-11-30 MED ORDER — SODIUM CHLORIDE 0.9 % IV SOLN: 500.0000 mL | Freq: Once | INTRAVENOUS | Status: DC

## 2020-11-30 NOTE — Patient Instructions (Signed)
Please read handouts provided. Continue present medications. Repeat colonoscopy in 5 years for screening.    YOU HAD AN ENDOSCOPIC PROCEDURE TODAY AT [facility]:   Refer to the procedure report that was given to you for any specific questions about what was found during the examination.  If the procedure report does not answer your questions, please call your gastroenterologist to clarify.  If you requested that your care partner not be given the details of your procedure findings, then the procedure report has been included in a sealed envelope for you to review at your convenience later.  YOU SHOULD EXPECT: Some feelings of bloating in the abdomen. Passage of more gas than usual.  Walking can help get rid of the air that was put into your GI tract during the procedure and reduce the bloating. If you had a lower endoscopy (such as a colonoscopy or flexible sigmoidoscopy) you may notice spotting of blood in your stool or on the toilet paper. If you underwent a bowel prep for your procedure, you may not have a normal bowel movement for a few days.  Please Note:  You might notice some irritation and congestion in your nose or some drainage.  This is from the oxygen used during your procedure.  There is no need for concern and it should clear up in a day or so.  SYMPTOMS TO REPORT IMMEDIATELY:   Following lower endoscopy (colonoscopy or flexible sigmoidoscopy):  Excessive amounts of blood in the stool  Significant tenderness or worsening of abdominal pains  Swelling of the abdomen that is new, acute  Fever of 100F or higher   For urgent or emergent issues, a gastroenterologist can be reached at any hour by calling [phone]. Do not use MyChart messaging for urgent concerns.    DIET:  We do recommend a small meal at first, but then you may proceed to your regular diet.  Drink plenty of fluids but you should avoid alcoholic beverages for 24 hours.  ACTIVITY:  You should  plan to take it easy for the rest of today and you should NOT DRIVE or use heavy machinery until tomorrow (because of the sedation medicines used during the test).    FOLLOW UP: Our staff will call the number listed on your records 48-72 hours following your procedure to check on you and address any questions or concerns that you may have regarding the information given to you following your procedure. If we do not reach you, we will leave a message.  We will attempt to reach you two times.  During this call, we will ask if you have developed any symptoms of COVID 19. If you develop any symptoms (ie: fever, flu-like symptoms, shortness of breath, cough etc.) before then, please call [phone].  If you test positive for Covid 19 in the 2 weeks post procedure, please call and report this information to [place].    If any biopsies were taken you will be contacted by phone or by letter within the next 1-3 weeks.  Please call us at [phone] if you have not heard about the biopsies in 3 weeks.    SIGNATURES/CONFIDENTIALITY: You and/or your care partner have signed paperwork which will be entered into your electronic medical record.  These signatures attest to the fact that that the information above on your After Visit Summary has been reviewed and is understood.  Full responsibility of the confidentiality of this discharge information lies with you and/or your care-partner.

## 2020-11-30 NOTE — Progress Notes (Signed)
VS by CW  Pt's states no medical or surgical changes since previsit or office visit.  

## 2020-11-30 NOTE — Progress Notes (Signed)
A/ox3, pleased with MAC, report to RN 

## 2020-11-30 NOTE — Op Note (Signed)
[place] Patient Name: Tajay Muzzy Procedure Date: 11/30/2020 2:37 PM MRN: [id number] Endoscopist: [place] Loletha Carrow , MD Age: 55 Referring MD:  Date of Birth: 1965-11-09 Gender: Male Account #: [id number] Procedure:                Colonoscopy Indications:              Surveillance: Personal history of adenomatous                            polyps on last colonoscopy 5 years ago - 19mm TA                            07/2015 (last exam done for Fam Hx CRC) Medicines:                Monitored Anesthesia Care Procedure:                Pre-Anesthesia Assessment:                           - Prior to the procedure, a History and Physical                            was performed, and patient medications and                            allergies were reviewed. The patient's tolerance of                            previous anesthesia was also reviewed. The risks                            and benefits of the procedure and the sedation                            options and risks were discussed with the patient.                            All questions were answered, and informed consent                            was obtained. Prior Anticoagulants: The patient has                            taken no previous anticoagulant or antiplatelet                            agents. ASA Grade Assessment: I - A normal, healthy                            patient. After reviewing the risks and benefits,                            the patient was deemed in satisfactory condition to  undergo the procedure.                           After obtaining informed consent, the colonoscope                            was passed under direct vision. Throughout the                            procedure, the patient's blood pressure, pulse, and                            oxygen saturations were monitored continuously. The                            Olympus CF-HQ190L [id number]) Colonoscope  was                            introduced through the anus and advanced to the the                            cecum, identified by appendiceal orifice and                            ileocecal valve. The colonoscopy was performed                            without difficulty. The patient tolerated the                            procedure well. The quality of the bowel                            preparation was good. The ileocecal valve,                            appendiceal orifice, and rectum were photographed. Scope In: 2:47:34 PM Scope Out: 3:02:56 PM Scope Withdrawal Time: 0 hours 9 minutes 48 seconds  Total Procedure Duration: 0 hours 15 minutes 22 seconds  Findings:                 The perianal and digital rectal examinations were                            normal.                           The entire examined colon appeared normal on direct                            and retroflexion views. Complications:            No immediate complications. Estimated Blood Loss:     Estimated blood loss: none. Impression:               - The entire examined colon is normal on direct and  retroflexion views.                           - No specimens collected. Recommendation:           - Patient has a contact number available for                            emergencies. The signs and symptoms of potential                            delayed complications were discussed with the                            patient. Return to normal activities tomorrow.                            Written discharge instructions were provided to the                            patient.                           - Resume previous diet.                           - Continue present medications.                           - Repeat colonoscopy in 5 years for screening                            purposes (Hx CRC in father). Subrina Vecchiarelli L. Loletha Carrow, MD 11/30/2020 3:08:28 PM This report has been signed  electronically.

## 2020-12-02 ENCOUNTER — Telehealth: Payer: Self-pay

## 2020-12-02 NOTE — Telephone Encounter (Signed)
  Follow up Call-  Call back number 11/30/2020  Post procedure Call Back phone  # [phone]  Permission to leave phone message Yes  Some recent data might be hidden     Patient questions:  Do you have a fever, pain , or abdominal swelling? No. Pain Score   0  *  Have you tolerated food without any problems? Yes.    Have you been able to return to your normal activities? Yes.    Do you have any questions about your discharge instructions: Diet   No. Medications  No. Follow up visit  No.  Do you have questions or concerns about your Care? No.  Actions: * If pain score is 4 or above: No action needed, pain <4.

## 2020-12-21 ENCOUNTER — Ambulatory Visit
Admission: RE | Admit: 2020-12-21 | Discharge: 2020-12-21 | Disposition: A | Discharge status: Home / Self Care | Payer: 59 | Source: Ambulatory Visit | Attending: Nurse Practitioner | Admitting: Nurse Practitioner

## 2020-12-21 ENCOUNTER — Other Ambulatory Visit: Payer: Self-pay | Admitting: Nurse Practitioner

## 2020-12-21 ENCOUNTER — Other Ambulatory Visit: Payer: Self-pay

## 2020-12-21 DIAGNOSIS — Z0000 Encounter for general adult medical examination without abnormal findings: Secondary | ICD-10-CM

## 2021-07-28 ENCOUNTER — Ambulatory Visit: Payer: 59 | Admitting: Gastroenterology

## 2021-07-28 ENCOUNTER — Encounter: Payer: Self-pay | Admitting: Gastroenterology

## 2021-07-28 VITALS — BP 104/66 | HR 65 | Ht 69.0 in | Wt 158.0 lb

## 2021-07-28 DIAGNOSIS — R1013 Epigastric pain: Secondary | ICD-10-CM | POA: Diagnosis not present

## 2021-07-28 DIAGNOSIS — K219 Gastro-esophageal reflux disease without esophagitis: Secondary | ICD-10-CM | POA: Diagnosis not present

## 2021-07-28 NOTE — Patient Instructions (Signed)
If you are age 57 or older, your body mass index should be between 23-30. Your Body mass index is 23.33 kg/m. If this is out of the aforementioned range listed, please consider follow up with your Primary Care Provider.  If you are age 33 or younger, your body mass index should be between 19-25. Your Body mass index is 23.33 kg/m. If this is out of the aformentioned range listed, please consider follow up with your Primary Care Provider.   ________________________________________________________  The [facility] providers would like to encourage you to use [facility] to communicate with providers for non-urgent requests or questions.  Due to long hold times on the telephone, sending your provider a message by [facility] may be a faster and more efficient way to get a response.  Please allow 48 business hours for a response.  Please remember that this is for non-urgent requests.  _______________________________________________________  [name] have been scheduled for an endoscopy. Please follow written instructions given to you at your visit today. If you use inhalers (even only as needed), please bring them with you on the day of your procedure.  It was a pleasure to see you today!  Thank you for trusting me with your gastrointestinal care!

## 2021-07-28 NOTE — Progress Notes (Signed)
Nicholas Townsend Progress Note:  History: Nicholas Townsend 07/28/2021  Referring provider: Everardo Beals, NP  Reason for consult/chief complaint: Gastroesophageal Reflux (Patient states recently had increase in reflux and nausea. Patient states he will wake up in the middle of the night with acid coming back up. Patient states the prilosec is no longer working. )   Subjective  HPI: Nicholas Townsend initially saw Nicholas Townsend in 2017 for GERD, IBS and family history of colon cancer.  He was not on regular acid suppression out of concern for possible long-term effects of such medicine, and was having occasional heartburn symptoms treated with vinegar.  For schedule reasons, he was put on with me for colonoscopy in 2017 with a diminutive adenoma, 5-year recall recommended.  He also had EGD at that time that did not reveal hiatal hernia nor Barrett's esophagus or esophagitis I saw him for colonoscopy in June 2022, it was a complete exam with good preparation and no polyps seen.  5-year recall recommended.  Prior upper endoscopy with Nicholas Townsend in 2009, 3 cm hiatal hernia noted.  Nicholas Townsend came to see me for recent worsening of his reflux symptoms.  For the last several weeks he has had intermittent epigastric or chest discomfort that is nonexertional and with a bloated feeling and sometimes heartburn.  It has occurred overnight early morning hours with feelings of regurgitation as well.  He takes the Prilosec as needed for symptoms and feels it is not working as well as it once did.  Denies dysphagia or odynophagia.  Appetite is decreased some, he does not think he is lost weight, but he seems to feel full more easily these days. Bowel habits are regular without rectal bleeding.  While he has experienced similar symptoms on and off for years, it is not typically lasted this long, and he has been become concerned because his father passed from pancreatic cancer.  ROS:  Review of Systems   Constitutional:  Negative for appetite change and unexpected weight change.  HENT:  Negative for mouth sores and voice change.   Eyes:  Negative for pain and redness.  Respiratory:  Negative for cough and shortness of breath.   Cardiovascular:  Negative for chest pain and palpitations.  Genitourinary:  Negative for dysuria and hematuria.  Musculoskeletal:  Negative for arthralgias and myalgias.  Skin:  Negative for pallor and rash.  Neurological:  Negative for weakness and headaches.  Hematological:  Negative for adenopathy.    Past Medical History: Past Medical History:  Diagnosis Date   Anxiety    Blood transfusion without reported diagnosis    Esophageal reflux    Family history of malignant neoplasm of gastrointestinal tract    Hiatal hernia    IBS (irritable bowel syndrome)      Past Surgical History: Past Surgical History:  Procedure Laterality Date   COLONOSCOPY  2017   FEMUR FRACTURE SURGERY     WISDOM TOOTH EXTRACTION       Family History: Family History  Problem Relation Age of Onset   Colon cancer Father 79   Hypertension Father    Skin cancer Father    Hypertension Mother    Breast cancer Sister    Colon cancer Paternal Uncle    Colon cancer Cousin    Colon cancer Paternal Grandmother    Colon polyps Neg Hx    Esophageal cancer Neg Hx    Rectal cancer Neg Hx    Stomach cancer Neg Hx     Social  History: Social History   Socioeconomic History   Marital status: Single    Spouse name: Not on file   Number of children: 3   Years of education: Not on file   Highest education level: Not on file  Occupational History   Occupation: FIREFIGHTER    Employer: UNEMPLOYED  Tobacco Use   Smoking status: Never   Smokeless tobacco: Never  Vaping Use   Vaping Use: Never used  Substance and Sexual Activity   Alcohol use: Yes    Alcohol/week: 0.0 standard drinks    Comment: Occasionally   Drug use: No   Sexual activity: Not on file  Other Topics  Concern   Not on file  Social History Narrative   Not on file   Social Determinants of Health   Financial Resource Strain: Not on file  Food Insecurity: Not on file  Transportation Needs: Not on file  Physical Activity: Not on file  Stress: Not on file  Social Connections: Not on file    Allergies: Allergies  Allergen Reactions   Codeine Phosphate     REACTION: GI upset    Outpatient Meds: Current Outpatient Medications  Medication Sig Dispense Refill   Aspirin-Caffeine (BC FAST PAIN RELIEF PO) Take by mouth.     LORazepam (ATIVAN) 0.5 MG tablet Take 0.5 mg by mouth at bedtime as needed for anxiety.     Multiple Vitamin (MULTIVITAMIN WITH MINERALS) TABS tablet Take 1 tablet by mouth daily.     omeprazole (PRILOSEC OTC) 20 MG tablet Take 20 mg by mouth as needed.     No current facility-administered medications for this visit.      ___________________________________________________________________ Objective   Exam:  BP 104/66    Pulse 65    Ht 5\' 9"  (1.753 m)    Wt 158 lb (71.7 kg)    BMI 23.33 kg/m  Wt Readings from Last 3 Encounters:  07/28/21 158 lb (71.7 kg)  11/30/20 167 lb (75.8 kg)  11/16/20 167 lb (75.8 kg)   Note weight is down from June 2022  General: Well appearing Eyes: sclera anicteric, no redness ENT: oral mucosa moist without lesions, no cervical or supraclavicular lymphadenopathy CV: RRR without murmur, S1/S2, no JVD, no peripheral edema Resp: clear to auscultation bilaterally, normal RR and effort noted GI: soft, no tenderness, with active bowel sounds. No guarding or palpable organomegaly noted. Skin; warm and dry, no rash or jaundice noted Neuro: awake, alert and oriented x 3. Normal gross motor function and fluent speech  No recent data for review  Assessment: Encounter Diagnoses  Name Primary?   Gastroesophageal reflux disease, unspecified whether esophagitis present Yes   Abdominal pain, epigastric     Years of intermittent  reflux symptoms and some epigastric discomfort with nausea, worse in last several weeks and more persistent than he usually has experienced. He is concerned as regards his father's history, especially because he recalls his father symptoms starting like this before he was diagnosed with pancreatic cancer.  Plan:  Upper endoscopy.  He was agreeable after discussion of procedure and risks  The benefits and risks of the planned procedure were described in detail with the patient or (when appropriate) their health care proxy.  Risks were outlined as including, but not limited to, bleeding, infection, perforation, adverse medication reaction leading to cardiac or pulmonary decompensation, pancreatitis (if ERCP).  The limitation of incomplete mucosal visualization was also discussed.  No guarantees or warranties were given.  I asked him to start taking  the Prilosec 20 mg daily, which she had been doing the last several days was not sure if he should continue.  He is appropriate for an endoscopic procedure in the outpatient setting. [name]  CC: Referring provider noted above

## 2021-08-02 ENCOUNTER — Ambulatory Visit (AMBULATORY_SURGERY_CENTER): Payer: 59 | Admitting: Gastroenterology

## 2021-08-02 ENCOUNTER — Encounter: Payer: Self-pay | Admitting: Gastroenterology

## 2021-08-02 VITALS — BP 109/68 | HR 68 | Temp 97.7°F | Resp 14 | Ht 69.0 in | Wt 158.0 lb

## 2021-08-02 DIAGNOSIS — B9681 Helicobacter pylori [H. pylori] as the cause of diseases classified elsewhere: Secondary | ICD-10-CM

## 2021-08-02 DIAGNOSIS — K319 Disease of stomach and duodenum, unspecified: Secondary | ICD-10-CM | POA: Diagnosis not present

## 2021-08-02 DIAGNOSIS — K2950 Unspecified chronic gastritis without bleeding: Secondary | ICD-10-CM | POA: Diagnosis not present

## 2021-08-02 DIAGNOSIS — K2970 Gastritis, unspecified, without bleeding: Secondary | ICD-10-CM

## 2021-08-02 DIAGNOSIS — K219 Gastro-esophageal reflux disease without esophagitis: Secondary | ICD-10-CM

## 2021-08-02 MED ORDER — SODIUM CHLORIDE 0.9 % IV SOLN: 500.0000 mL | Freq: Once | INTRAVENOUS | Status: DC

## 2021-08-02 NOTE — Progress Notes (Signed)
No changes to clinical history since GI office visit on 07/28/21.  The patient is appropriate for an endoscopic procedure in the ambulatory setting.

## 2021-08-02 NOTE — Progress Notes (Signed)
Called to room to assist during endoscopic procedure.  Patient ID and intended procedure confirmed with present staff. Received instructions for my participation in the procedure from the performing physician.  

## 2021-08-02 NOTE — Patient Instructions (Signed)
Resume previous diet.  Continue present medications. As discussed at recent office visit, take omeprazole daily for next several weeks.  Await pathology results.  Return to my office at appointment to be scheduled  YOU HAD AN ENDOSCOPIC PROCEDURE TODAY AT [place]:   Refer to the procedure report that was given to you for any specific questions about what was found during the examination.  If the procedure report does not answer your questions, please call your gastroenterologist to clarify.  If you requested that your care partner not be given the details of your procedure findings, then the procedure report has been included in a sealed envelope for you to review at your convenience later.  YOU SHOULD EXPECT: Some feelings of bloating in the abdomen. Passage of more gas than usual.  Walking can help get rid of the air that was put into your GI tract during the procedure and reduce the bloating. If you had a lower endoscopy (such as a colonoscopy or flexible sigmoidoscopy) you may notice spotting of blood in your stool or on the toilet paper. If you underwent a bowel prep for your procedure, you may not have a normal bowel movement for a few days.  Please Note:  You might notice some irritation and congestion in your nose or some drainage.  This is from the oxygen used during your procedure.  There is no need for concern and it should clear up in a day or so.  SYMPTOMS TO REPORT IMMEDIATELY:  Following upper endoscopy (EGD)  Vomiting of blood or coffee ground material  New chest pain or pain under the shoulder blades  Painful or persistently difficult swallowing  New shortness of breath  Fever of 100F or higher  Black, tarry-looking stools  For urgent or emergent issues, a gastroenterologist can be reached at any hour by calling [phone]. Do not use MyChart messaging for urgent concerns.    DIET:  We do recommend a small meal at first, but then you may proceed  to your regular diet.  Drink plenty of fluids but you should avoid alcoholic beverages for 24 hours.  ACTIVITY:  You should plan to take it easy for the rest of today and you should NOT DRIVE or use heavy machinery until tomorrow (because of the sedation medicines used during the test).    FOLLOW UP: Our staff will call the number listed on your records 48-72 hours following your procedure to check on you and address any questions or concerns that you may have regarding the information given to you following your procedure. If we do not reach you, we will leave a message.  We will attempt to reach you two times.  During this call, we will ask if you have developed any symptoms of COVID 19. If you develop any symptoms (ie: fever, flu-like symptoms, shortness of breath, cough etc.) before then, please call [phone].  If you test positive for Covid 19 in the 2 weeks post procedure, please call and report this information to [place].    If any biopsies were taken you will be contacted by phone or by letter within the next 1-3 weeks.  Please call us at [phone] if you have not heard about the biopsies in 3 weeks.    SIGNATURES/CONFIDENTIALITY: You and/or your care partner have signed paperwork which will be entered into your electronic medical record.  These signatures attest to the fact that that the information above on your After Visit Summary has been  reviewed and is understood.  Full responsibility of the confidentiality of this discharge information lies with you and/or your care-partner.

## 2021-08-02 NOTE — Op Note (Signed)
[facility] Patient Name: Nicholas Townsend Procedure Date: 08/02/2021 10:21 AM MRN: [id number] Endoscopist: [place]. Loletha Carrow , MD Age: 56 Referring MD:  Date of Birth: 1966/01/09 Gender: Male Account #: [id number] Procedure:                Upper GI endoscopy Indications:              Indigestion, Esophageal reflux symptoms that                            persist despite appropriate therapy Medicines:                Monitored Anesthesia Care Procedure:                Pre-Anesthesia Assessment:                           - Prior to the procedure, a History and Physical                            was performed, and patient medications and                            allergies were reviewed. The patient's tolerance of                            previous anesthesia was also reviewed. The risks                            and benefits of the procedure and the sedation                            options and risks were discussed with the patient.                            All questions were answered, and informed consent                            was obtained. Prior Anticoagulants: The patient has                            taken no previous anticoagulant or antiplatelet                            agents. ASA Grade Assessment: II - A patient with                            mild systemic disease. After reviewing the risks                            and benefits, the patient was deemed in                            satisfactory condition to undergo the procedure.  After obtaining informed consent, the endoscope was                            passed under direct vision. Throughout the                            procedure, the patient's blood pressure, pulse, and                            oxygen saturations were monitored continuously. The                            GIF HQ190 #[id number] was introduced through the                            mouth, and advanced to the  second part of duodenum.                            The upper GI endoscopy was accomplished without                            difficulty. The patient tolerated the procedure                            well. Scope In: Scope Out: Findings:                 The esophagus was generally normal. Specifically,                            no esophagitis, stricture, mass or Barrett's                            esophagus was seen. There may be a small sliding                            hiatal hernia (apparent on first retroflexion view,                            not on second - see phtotos)                           A few erosions with no bleeding and no stigmata of                            recent bleeding were found in the gastric antrum.                            Mild patchy erythema in that area as well. Several                            biopsies were obtained in the gastric body and in  the gastric antrum with cold forceps for histology.                           The exam of the stomach was otherwise normal,                            including on retroflexion.                           The examined duodenum was normal. Complications:            No immediate complications. Estimated Blood Loss:     Estimated blood loss was minimal. Impression:               - Normal esophagus.                           - Erosive gastropathy with no bleeding and no                            stigmata of recent bleeding.                           - Normal examined duodenum.                           - Several biopsies were obtained in the gastric                            body and in the gastric antrum. Recommendation:           - Patient has a contact number available for                            emergencies. The signs and symptoms of potential                            delayed complications were discussed with the                            patient. Return to normal  activities tomorrow.                            Written discharge instructions were provided to the                            patient.                           - Resume previous diet.                           - Continue present medications. As discussed at                            recent office visit, take omeprazole daily for next  several weeks.                           - Await pathology results.                           - Return to my office at appointment to be                            scheduled. Aleiya Rye L. Loletha Carrow, MD 08/02/2021 10:41:07 AM This report has been signed electronically.

## 2021-08-02 NOTE — Progress Notes (Signed)
Report to PACU, RN, vss, BBS= Clear.  

## 2021-08-02 NOTE — Progress Notes (Signed)
Vitals-DT  History reviewed. 

## 2021-08-03 ENCOUNTER — Telehealth: Payer: Self-pay

## 2021-08-03 NOTE — Telephone Encounter (Signed)
Per 08/02/21 procedure report - Return to my office at appt to be scheduled  Called and spoke with patient. He has been scheduled for a f/u appt with Dr. Loletha Carrow on Friday, 09/01/21 at 10:40 am. Pt verbalized understanding and had no concerns at the end of the call.

## 2021-08-04 ENCOUNTER — Telehealth: Payer: Self-pay

## 2021-08-04 ENCOUNTER — Other Ambulatory Visit: Payer: Self-pay

## 2021-08-04 MED ORDER — OMEPRAZOLE 20 MG PO CPDR: 20.0000 mg | DELAYED_RELEASE_CAPSULE | Freq: Two times a day (BID) | ORAL | 0 refills | Status: DC

## 2021-08-04 MED ORDER — BISMUTH SUBSALICYLATE 262 MG PO TABS: 524.0000 mg | ORAL_TABLET | Freq: Four times a day (QID) | ORAL | 0 refills | Status: AC

## 2021-08-04 MED ORDER — METRONIDAZOLE 500 MG PO TABS: 500.0000 mg | ORAL_TABLET | Freq: Three times a day (TID) | ORAL | 0 refills | Status: AC

## 2021-08-04 MED ORDER — DOXYCYCLINE HYCLATE 100 MG PO CAPS: 100.0000 mg | ORAL_CAPSULE | Freq: Two times a day (BID) | ORAL | 0 refills | Status: AC

## 2021-08-04 NOTE — Telephone Encounter (Signed)
Called [phone] and left a message we tried to reach pt for a follow up call. maw

## 2021-08-08 ENCOUNTER — Telehealth: Payer: Self-pay | Admitting: Gastroenterology

## 2021-08-08 NOTE — Telephone Encounter (Signed)
Attempted to reach pt twice, his phone goes straight to vm. I left a vm on his mobile vm for patient to return call.

## 2021-08-08 NOTE — Telephone Encounter (Signed)
Inbound call from patient requesting to speak with a nurse please.  Has questions about the treatment he is taking for  H. Pylori.

## 2021-08-09 NOTE — Telephone Encounter (Signed)
Pt returned call. He wanted to know when he might start feeling better. I told him it is a 2-week treatment course and it may take that long for him to notice a difference. I told pt that he needs to make sure that he is eating something light before taking the antibitoics because they can be harsh on his stomach. I reminded pt to avoid alcohol as well. Pt has been advised to keep his f/u appt as scheduled. Pt verbalized understanding and had no concerns at the end of the call.

## 2021-09-01 ENCOUNTER — Encounter: Payer: Self-pay | Admitting: Gastroenterology

## 2021-09-01 ENCOUNTER — Ambulatory Visit: Payer: 59 | Admitting: Gastroenterology

## 2021-09-01 VITALS — BP 102/70 | HR 70 | Ht 69.0 in | Wt 157.0 lb

## 2021-09-01 DIAGNOSIS — R1013 Epigastric pain: Secondary | ICD-10-CM

## 2021-09-01 DIAGNOSIS — R194 Change in bowel habit: Secondary | ICD-10-CM

## 2021-09-01 DIAGNOSIS — R140 Abdominal distension (gaseous): Secondary | ICD-10-CM

## 2021-09-01 DIAGNOSIS — K219 Gastro-esophageal reflux disease without esophagitis: Secondary | ICD-10-CM

## 2021-09-01 DIAGNOSIS — A048 Other specified bacterial intestinal infections: Secondary | ICD-10-CM

## 2021-09-01 NOTE — Progress Notes (Signed)
? ? ? ?  [facility] Progress Note ? ?Chief Complaint: Dyspepsia and H. pylori ? ?Subjective  ?History: ?Lonie follows up after his recent upper endoscopy.  He was seen in the office February 3 with recently worsening reflux symptoms and epigastric discomfort.  EGD 08/02/2021 normal except for some mild patchy antral erosions and erythema, biopsies positive for H. pylori.  This was treated with bismuth based quadruple therapy. ?Today Alessandro reports finishing his H. pylori treatment February 24, he felt well until a few days ago when he developed gas and BM frequency but not loose or bloody stool ? ?Maze did not see much improvement in his dyspepsia or heartburn until near the end of the H. pylori treatment.  He was then well until about 48 hours ago when he developed gas bloating and increased stool frequency, bowel movements soft but not loose and not bloody. ? ?ROS: ?Cardiovascular:  no chest pain ?Respiratory: no dyspnea ? ?The patient's Past Medical, Family and Social History were reviewed and are on file in the EMR. ? ?Objective: ? ?Med list reviewed ? ?Current Outpatient Medications:  ?  LORazepam (ATIVAN) 0.5 MG tablet, Take 0.5 mg by mouth at bedtime as needed for anxiety., Disp: , Rfl:  ?  Multiple Vitamin (MULTIVITAMIN WITH MINERALS) TABS tablet, Take 1 tablet by mouth daily., Disp: , Rfl:  ? ? ?Vital signs in last 24 hrs: ?Vitals:  ? 09/01/21 1016  ?BP: 102/70  ?Pulse: 70  ? ?Wt Readings from Last 3 Encounters:  ?09/01/21 157 lb (71.2 kg)  ?08/02/21 158 lb (71.7 kg)  ?07/28/21 158 lb (71.7 kg)  ?  ?Physical Exam ? ?Well-appearing ?HEENT: sclera anicteric, oral mucosa moist without lesions ?Neck: supple, no thyromegaly, JVD or lymphadenopathy ?Cardiac: RRR without murmurs, S1S2 heard, no peripheral edema ?Pulm: clear to auscultation bilaterally, normal RR and effort noted ?Abdomen: soft, mild LUQ tenderness, with active bowel sounds. No guarding or palpable hepatosplenomegaly. ?Skin; warm and dry, no jaundice  or rash ? ?Labs: ? ? ?___________________________________________ ?Radiologic studies: ? ? ?____________________________________________ ?Other: ? ? ?_____________________________________________ ?Assessment & Plan  ?Assessment: ?Encounter Diagnoses  ?Name Primary?  ? H. pylori infection Yes  ? Dyspepsia   ? Gastroesophageal reflux disease without esophagitis   ? Abdominal bloating   ? Change in bowel habits   ? ? ?Difficult to tell quite yet to what extent the H. pylori contributed to his symptoms that were worse in the last few months.  He does report feeling better toward the end of that treatment until he developed some new symptoms in the last few days.  I am hopeful that may be transient symptoms after having received antibiotics, though if it were to continue and certainly if it worsens, must rule out C. difficile. ? ?Plan: ?We have tentatively planned a urea breath test for next week.  (Reginold has been off all acid suppression medicine since completing H. pylori therapy because he no longer felt he needed it, and to have an accurate H. pylori follow-up test.) ? ?However, he will contact our office next week if these recent onset symptoms have not improved or if they have worsened.  In that case, then instead of the UBT, we would do stool study for stool antigen and C. difficile PCR. ? ? ?[name] ? ?

## 2021-09-01 NOTE — Patient Instructions (Signed)
If you are age 57 or older, your body mass index should be between 23-30. Your Body mass index is 23.18 kg/m?Marland Kitchen If this is out of the aforementioned range listed, please consider follow up with your Primary Care Provider. ? ?If you are age 16 or younger, your body mass index should be between 19-25. Your Body mass index is 23.18 kg/m?Marland Kitchen If this is out of the aformentioned range listed, please consider follow up with your Primary Care Provider.  ? ?________________________________________________________ ? ?The [facility] providers would like to encourage you to use [facility] to communicate with providers for non-urgent requests or questions.  Due to long hold times on the telephone, sending your provider a message by [facility] may be a faster and more efficient way to get a response.  Please allow 48 business hours for a response.  Please remember that this is for non-urgent requests.  ?_______________________________________________________ ? ?[facility] lab: H.pylori breath test ? ?[address], [place], [place] [address] ? ?Phone: [phone] ? ?It was a pleasure to see you today! ? ?Thank you for trusting me with your gastrointestinal care!   ? ? ?

## 2021-09-04 ENCOUNTER — Telehealth: Payer: Self-pay

## 2021-09-04 ENCOUNTER — Other Ambulatory Visit: Payer: 59

## 2021-09-04 DIAGNOSIS — R194 Change in bowel habit: Secondary | ICD-10-CM

## 2021-09-04 DIAGNOSIS — R197 Diarrhea, unspecified: Secondary | ICD-10-CM

## 2021-09-04 DIAGNOSIS — A048 Other specified bacterial intestinal infections: Secondary | ICD-10-CM

## 2021-09-04 DIAGNOSIS — R1013 Epigastric pain: Secondary | ICD-10-CM

## 2021-09-04 NOTE — Telephone Encounter (Signed)
Dr.Gessner as DOD PM of 09/04/21 please advise, thanks. ? ?Dr. Loletha Carrow pt that was seen on 09/01/21 for H. pylori infection, Dyspepsia, Gastroesophageal reflux disease without esophagitis, Abdominal bloating, and Change in bowel habits.  ? ?Please see prior note. Pt having ongoing loose stools. Pt submitted C. Diff and H. Pylori stool antigen sample today. I called him back and he stated that he would like to go ahead and get started on treatment for C. Diff because his stools "look like, walk like, and talk like" C diff. He is adamant that he has C. Diff. I told him that it would be best to await the stool study results that he submitted today. He asked what would happen if he is + for C. Diff and H. Pylori, I told him that C. Diff would more than likely take precedence since it is highly contagious. Pt requested C. Diff treatment several times. Pt requested that I relay the information to the covering provider since Dr. Loletha Carrow is out of the office this week. ?

## 2021-09-04 NOTE — Telephone Encounter (Signed)
Await test results. ? ?Hydrate well.  If he has not tried Imodium A-D he may do so. ? ?We would treat C. difficile over H. pylori. ? ? ?

## 2021-09-04 NOTE — Telephone Encounter (Signed)
Pt came into the office requesting to speak with you. He stated that he had a couple of questions for you. Pls call him when you have a chance.  ?

## 2021-09-04 NOTE — Telephone Encounter (Signed)
Pt called into the office this morning with an update on his symptoms. Pt states that he is still passing small volumes of loose stool. He states that he has been having lower abdominal cramping that comes every 20-25 minutes followed by a BM. Pt does report chills also. Per last office note, "However, he will contact our office next week if these recent onset symptoms have not improved or if they have worsened.  In that case, then instead of the UBT, we would do stool study for stool antigen and C. difficile PCR." Pt is aware that I will place the orders for the stool studies and he can stop by at his convenience today to pick up the stool kits and instructions. Pt is aware that no appt is necessary. Pt verbalized understanding and had no concerns at the end of the call.  ?

## 2021-09-04 NOTE — Telephone Encounter (Signed)
Called and spoke with patient regarding Dr. Celesta Aver recommendations. Pt verbalized understanding. ?

## 2021-09-06 ENCOUNTER — Encounter: Payer: Self-pay | Admitting: Gastroenterology

## 2021-09-06 ENCOUNTER — Telehealth: Payer: Self-pay | Admitting: Gastroenterology

## 2021-09-06 LAB — H. PYLORI ANTIGEN, STOOL: H pylori Ag, Stl: NEGATIVE

## 2021-09-06 NOTE — Telephone Encounter (Signed)
Inbound call from patient requesting lab results from 03/13. Please advise.  ?

## 2021-09-07 ENCOUNTER — Other Ambulatory Visit (INDEPENDENT_AMBULATORY_CARE_PROVIDER_SITE_OTHER): Payer: 59

## 2021-09-07 DIAGNOSIS — R197 Diarrhea, unspecified: Secondary | ICD-10-CM | POA: Diagnosis not present

## 2021-09-07 DIAGNOSIS — R1013 Epigastric pain: Secondary | ICD-10-CM | POA: Diagnosis not present

## 2021-09-07 DIAGNOSIS — R140 Abdominal distension (gaseous): Secondary | ICD-10-CM | POA: Diagnosis not present

## 2021-09-07 DIAGNOSIS — R194 Change in bowel habit: Secondary | ICD-10-CM | POA: Diagnosis not present

## 2021-09-07 LAB — CBC
HCT: 41.3 % (ref 39.0–52.0)
Hemoglobin: 13.7 g/dL (ref 13.0–17.0)
MCHC: 33.2 g/dL (ref 30.0–36.0)
MCV: 91.7 fl (ref 78.0–100.0)
Platelets: 250 10*3/uL (ref 150.0–400.0)
RBC: 4.5 Mil/uL (ref 4.22–5.81)
RDW: 13.5 % (ref 11.5–15.5)
WBC: 4.3 10*3/uL (ref 4.0–10.5)

## 2021-09-07 LAB — COMPREHENSIVE METABOLIC PANEL
ALT: 22 U/L (ref 0–53)
AST: 20 U/L (ref 0–37)
Albumin: 4.7 g/dL (ref 3.5–5.2)
Alkaline Phosphatase: 49 U/L (ref 39–117)
BUN: 19 mg/dL (ref 6–23)
CO2: 29 mEq/L (ref 19–32)
Calcium: 10 mg/dL (ref 8.4–10.5)
Chloride: 103 mEq/L (ref 96–112)
Creatinine, Ser: 1.21 mg/dL (ref 0.40–1.50)
GFR: 66.98 mL/min (ref >60.00)
Glucose, Bld: 88 mg/dL (ref 70–99)
Potassium: 4.4 mEq/L (ref 3.5–5.1)
Sodium: 140 mEq/L (ref 135–145)
Total Bilirubin: 0.6 mg/dL (ref 0.2–1.2)
Total Protein: 6.8 g/dL (ref 6.0–8.3)

## 2021-09-07 LAB — CLOSTRIDIUM DIFFICILE BY PCR: Toxigenic C. Difficile by PCR: NEGATIVE

## 2021-09-07 LAB — C-REACTIVE PROTEIN: CRP: <1 mg/dL (ref 0.5–20.0)

## 2021-09-07 MED ORDER — DICYCLOMINE HCL 10 MG PO CAPS: ORAL_CAPSULE | ORAL | 1 refills | Status: DC

## 2021-09-07 NOTE — Telephone Encounter (Signed)
Call [facility] to see if additional stool tests could be added. I called and spoke with Truman Hayward at [facility], he states that it looks like the fecal calprotectin can be added but not the GI profile. He will fax a form for a signature if they are able to add.  ? ?Lab/stool study orders and CT order in epic. Secure staff message sent to radiology scheduling to contact pt to set up an appt.  ? ?Called and spoke with patient regarding Dr.Gupta's recommendations. Pt will come by for labs and additional stool test.  He knows to expect a call from radiology scheduling to set up his CT appt, I provided him with their phone number to call in case he does not hear from them in a timely manner. Pt requested that RX be sent to [facility] in [place], [place]. Patient has been scheduled for a f/u appt with Dr. Loletha Carrow on Tuesday, 10/10/21 at 9:20 am. Pt verbalized understanding of all information and had no concerns at the end of the call. ?

## 2021-09-07 NOTE — Telephone Encounter (Signed)
This has been addressed. Please see 09/06/21 telephone encounter.  ?

## 2021-09-07 NOTE — Telephone Encounter (Signed)
Dr. Lyndel Safe, as DOD AM of 09/07/21  ? ?Dr. Loletha Carrow pt with a history of H. pylori infection, Dyspepsia, Gastroesophageal reflux disease without esophagitis, Abdominal bloating, and Change in bowel habits ? ?Returned call to patient. I told him that the MD has not officially reviewed his results but H. Pylori and C. Diff stool tests were negative. Pt reports that he has continued symptoms. He reports that his stools are now liquid diarrhea. He reports lower abdominal cramping every 2 hours or so followed by the need to have a BM. Pt reports that he tried taking 2 Imodium and saw no improvement in symptoms. He denies any fever or blood in the stool that he can tell. He reports having 3 episodes of liquid diarrhea yesterday and 2 episodes so far today. Pt states that he is still eating and drinking but he has lost about 7 lbs in the last 3-4 weeks. Please advise, thanks.  ?

## 2021-09-07 NOTE — Telephone Encounter (Signed)
Orpah Clinton, RN; Pait, April H ?Patient scheduled on 3/22 @ [facility] and is aware of appt  ? ?Vivien Rota  ?

## 2021-09-07 NOTE — Telephone Encounter (Signed)
DOD ? ?Have reviewed previous notes, EGD and colonoscopy reports ? ?Continues to have diarrhea with lower abdominal pain and weight loss ? ?Negative stool studies for C. difficile and HP antigen.  ? ?Plan: ?-CBC, CMP, CRP, celiac screen ?-CT AP with PO/IV contrast ?-Can we add GI pathogen and calprotectin to the stool specimen? ?-Add Bentyl 10 mg p.o. QID x 2 days, then twice daily (H/O IBS) ?-FU APP clinic in 4-6 weeks please ? ?RG ? ?RG ?

## 2021-09-08 LAB — TISSUE TRANSGLUTAMINASE ABS,IGG,IGA
(tTG) Ab, IgA: <1 U/mL
(tTG) Ab, IgG: <1 U/mL

## 2021-09-08 LAB — IGA: Immunoglobulin A: 146 mg/dL (ref 47–310)

## 2021-09-11 LAB — CALPROTECTIN, FECAL: Calprotectin, Fecal: 20 ug/g (ref 0–120)

## 2021-09-11 LAB — SPECIMEN STATUS REPORT

## 2021-09-13 ENCOUNTER — Encounter (HOSPITAL_BASED_OUTPATIENT_CLINIC_OR_DEPARTMENT_OTHER): Payer: Self-pay

## 2021-09-13 ENCOUNTER — Ambulatory Visit (HOSPITAL_BASED_OUTPATIENT_CLINIC_OR_DEPARTMENT_OTHER)
Admission: RE | Admit: 2021-09-13 | Discharge: 2021-09-13 | Disposition: A | Discharge status: Home / Self Care | Payer: 59 | Source: Ambulatory Visit | Attending: Gastroenterology | Admitting: Gastroenterology

## 2021-09-13 ENCOUNTER — Other Ambulatory Visit: Payer: Self-pay

## 2021-09-13 DIAGNOSIS — R197 Diarrhea, unspecified: Secondary | ICD-10-CM | POA: Insufficient documentation

## 2021-09-13 DIAGNOSIS — R140 Abdominal distension (gaseous): Secondary | ICD-10-CM | POA: Insufficient documentation

## 2021-09-13 DIAGNOSIS — R1013 Epigastric pain: Secondary | ICD-10-CM | POA: Diagnosis present

## 2021-09-13 DIAGNOSIS — R194 Change in bowel habit: Secondary | ICD-10-CM | POA: Insufficient documentation

## 2021-09-13 MED ORDER — IOHEXOL 300 MG/ML  SOLN
75.0000 mL | Freq: Once | INTRAMUSCULAR | Status: AC | PRN
  Administered 2021-09-13: 75 mL via INTRAVENOUS

## 2021-09-15 ENCOUNTER — Telehealth: Payer: Self-pay | Admitting: Gastroenterology

## 2021-09-15 NOTE — Telephone Encounter (Signed)
I returned call to patient. See 09/06/21 telephone encounter for details. ?

## 2021-09-15 NOTE — Telephone Encounter (Signed)
Lm on vm for patient to return call 

## 2021-09-15 NOTE — Telephone Encounter (Signed)
Since he no longer has loose/liquid stools, he does not need to submit any further specimens for GI pathogen panel.  ? ?This seems to have been a severe effect of the antibiotics that he took for the H. pylori.  I do not think they were allergies or reactions as such, it was most likely a disturbance of the normal colonic bacterial balance to cause cramps and diarrhea. ? ?I expect this will slowly continue to resolve on its own.  He can use the Bentyl if he feels the symptoms are bothersome enough to warrant that, but it is also fine to let it run its course. ? ?Please ask him to contact us with an update in 7 to 10 days. ? ?- HD ?

## 2021-09-15 NOTE — Telephone Encounter (Signed)
Inbound call from patient wanted to speak with you. Please advise.  ?

## 2021-09-15 NOTE — Telephone Encounter (Addendum)
His CT san is normal.  Specifically, there is no inflammation seen in the GI tract to suggest Crohn's disease or colitis. ? ?As he probably knows by now, his lab work was all normal as well. ?Stool test negative for C. difficile, and it confirmed clearance of the H. pylori infection. ? ?What is occurring with his diarrhea, and has the Bentyl been of any help? ? ?- HD ?

## 2021-09-15 NOTE — Telephone Encounter (Signed)
Called and spoke with patient regarding results. Pt states that he will submit the GI profile stool sample next week. Pt reports that he is back to having solid stools, but still frequent BM's. Pt reports that he has several bowel movements a day. Stools are formed but he is passing some mucous. Pt reports that he will have abdominal pain about 10 minutes before he needs to have a BM, after BM pain resolves. Pt states that he has not been taking the Bentyl, he stated that the Bentyl helped but he said the pain usually alerts him that he needs to find a bathroom. Overall he is improving. Do you have any additional recommendations for him at this time? ?

## 2021-09-15 NOTE — Telephone Encounter (Signed)
Patient returned your call.

## 2021-09-15 NOTE — Telephone Encounter (Signed)
Pt returned call. I reviewed Dr. Loletha Carrow' recommendations with patient. Pt will call us with an update in about a week or so, sooner if symptoms return. Pt verbalized understanding of all information and had no concerns at the end of the call. ?

## 2021-09-19 ENCOUNTER — Telehealth: Payer: Self-pay | Admitting: Gastroenterology

## 2021-09-19 NOTE — Telephone Encounter (Signed)
Report has been sent to pt PCP: ?Pt notified of recent results from CT scan: ?Pt verbalized understanding with all questions answered.  ?

## 2021-09-19 NOTE — Telephone Encounter (Signed)
Patient returned your call, please advise. 

## 2021-10-10 ENCOUNTER — Ambulatory Visit: Payer: 59 | Admitting: Gastroenterology

## 2021-10-10 ENCOUNTER — Telehealth: Payer: Self-pay | Admitting: Gastroenterology

## 2021-10-10 NOTE — Telephone Encounter (Signed)
Good Morning Dr. Loletha Carrow, ? ? ?Patient called and stated that he has an appointment with you this morning at 9:20. Patient stated that he thought his appointment was already canceled, but realized yesterday that it wasn't. Patient stated he was feeling better and did not need to come in. Patient stated that if things seemed to come back, he would make an appointment to come in. ? ? ?

## 2021-10-10 NOTE — Progress Notes (Deleted)
? ? ? ? GI Progress Note ? ?Chief Complaint: *** ? ?Subjective  ?History: ?Nicholas Townsend was seen in follow-up for abdominal pain and diarrhea.  I last saw him March 10, and his dyspepsia was improved soon after completing quadruple therapy for H. pylori discovered on recent EGD.  He was having some loose stool, and the following week that escalated and he contacted our office.  Advice from covering physician recommended stool for C. difficile PCR (which returned negative) and H. pylori stool antigen (which returned negative) as well as CT abdomen and pelvis.  Fecal calprotectin was normal at 20 and he was prescribed dicyclomine.  Follow-up phone note indicated his symptoms were improving. ?*** ? ?ROS: ?Cardiovascular:  no chest pain ?Respiratory: no dyspnea ? ?The patient's Past Medical, Family and Social History were reviewed and are on file in the EMR. ? ?Objective: ? ?Med list reviewed ? ?Current Outpatient Medications:  ?  dicyclomine (BENTYL) 10 MG capsule, Take 1 capsule (10 mg total) by mouth 3 times daily before meals and at bedtime for 2 days. Then take 1 capsule (10 mg total) by mouth twice a day., Disp: 60 capsule, Rfl: 1 ?  LORazepam (ATIVAN) 0.5 MG tablet, Take 0.5 mg by mouth at bedtime as needed for anxiety., Disp: , Rfl:  ?  Multiple Vitamin (MULTIVITAMIN WITH MINERALS) TABS tablet, Take 1 tablet by mouth daily., Disp: , Rfl:  ? ? ?Vital signs in last 24 hrs: ?There were no vitals filed for this visit. ?Wt Readings from Last 3 Encounters:  ?09/01/21 157 lb (71.2 kg)  ?08/02/21 158 lb (71.7 kg)  ?07/28/21 158 lb (71.7 kg)  ?  ?Physical Exam ? ?*** ?HEENT: sclera anicteric, oral mucosa moist without lesions ?Neck: supple, no thyromegaly, JVD or lymphadenopathy ?Cardiac: RRR without murmurs, S1S2 heard, no peripheral edema ?Pulm: clear to auscultation bilaterally, normal RR and effort noted ?Abdomen: soft, *** tenderness, with active bowel sounds. No guarding or palpable hepatosplenomegaly. ?Skin;  warm and dry, no jaundice or rash ? ?Labs: ? ? ?___________________________________________ ?Radiologic studies: ?CLINICAL DATA:  Abdominal pain and bloating. Change in bowel habits. ?  ?EXAM: ?CT ABDOMEN AND PELVIS WITH CONTRAST ?  ?TECHNIQUE: ?Multidetector CT imaging of the abdomen and pelvis was performed ?using the standard protocol following bolus administration of ?intravenous contrast. ?  ?RADIATION DOSE REDUCTION: This exam was performed according to the ?departmental dose-optimization program which includes automated ?exposure control, adjustment of the mA and/or kV according to ?patient size and/or use of iterative reconstruction technique. ?  ?CONTRAST:  16m OMNIPAQUE IOHEXOL 300 MG/ML  SOLN ?  ?COMPARISON:  None. ?  ?FINDINGS: ?Lower Chest: No acute findings. ?  ?Hepatobiliary: No hepatic masses identified. A few small benign ?hepatic cysts are seen, largest measuring 2 cm. Gallbladder is ?unremarkable. No evidence of biliary ductal dilatation. ?  ?Pancreas:  No mass or inflammatory changes. ?  ?Spleen: Within normal limits in size and appearance. ?  ?Adrenals/Urinary Tract: No masses identified. Tiny sub-cm left renal ?cyst noted. No evidence of ureteral calculi or hydronephrosis. ?  ?Stomach/Bowel: No evidence of obstruction, inflammatory process or ?abnormal fluid collections. ?  ?Vascular/Lymphatic: No pathologically enlarged lymph nodes. No acute ?vascular findings. ?  ?Reproductive:  No mass or other significant abnormality. ?  ?Other:  None. ?  ?Musculoskeletal:  No suspicious bone lesions identified. ?  ?IMPRESSION: ?No acute findings or other significant abnormality. ?  ?  ?Electronically Signed ?  By: JMarlaine HindM.D. ?  On: 09/13/2021 23:19 ? ? ?  ____________________________________________ ?Other: ? ? ?_____________________________________________ ?Assessment & Plan  ?Assessment: ?No diagnosis found. ? ? ? ?Plan: ? ? ?*** minutes were spent on this encounter (including chart review,  history/exam, counseling/coordination of care, and documentation) > 50% of that time was spent on counseling and coordination of care.  ? ?Nicholas Townsend ? ?

## 2022-09-08 ENCOUNTER — Other Ambulatory Visit: Payer: Self-pay | Admitting: Gastroenterology

## 2022-09-08 DIAGNOSIS — R1013 Epigastric pain: Secondary | ICD-10-CM

## 2022-09-08 DIAGNOSIS — R140 Abdominal distension (gaseous): Secondary | ICD-10-CM

## 2022-09-08 DIAGNOSIS — R197 Diarrhea, unspecified: Secondary | ICD-10-CM

## 2022-09-08 DIAGNOSIS — R194 Change in bowel habit: Secondary | ICD-10-CM

## 2023-08-08 IMAGING — CT CT ABD-PELV W/ CM
2 of 5 series · 16 of 46 positions shown, 18 images · IV contrast (APPLIED)
Comparison: None.

CLINICAL DATA: Abdominal pain and bloating. Change in bowel habits.

EXAM:
CT ABDOMEN AND PELVIS WITH CONTRAST
TECHNIQUE: Multidetector CT imaging of the abdomen and pelvis was performed
using the standard protocol following bolus administration of
intravenous contrast.

[Series 2: abd pel w · axial · 0.65mm/px · z∈[+781,+1206]mm · 13 of 95 slices shown, 15 images]
[im 5/95  soft-tissue]
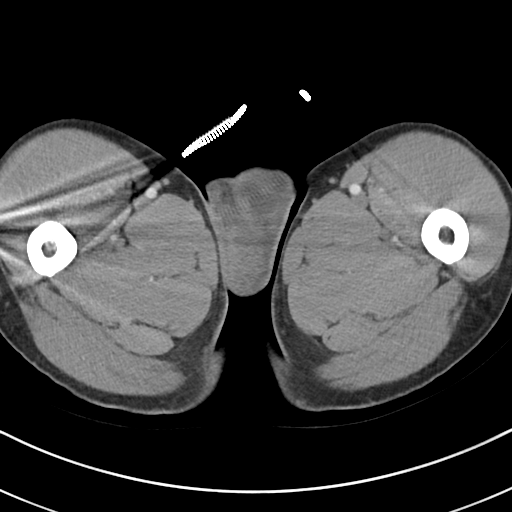
[im 5/95  bone]
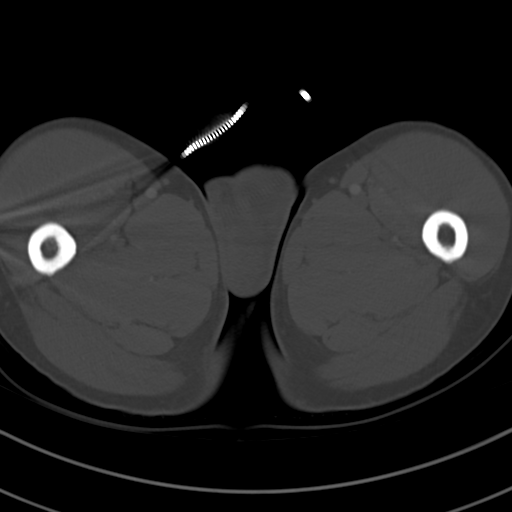
[im 15/95  soft-tissue]
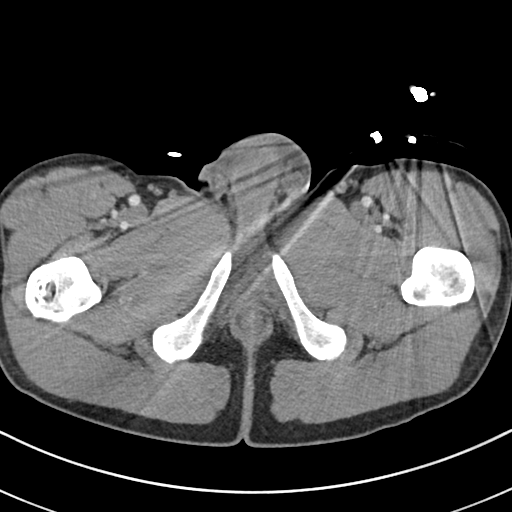
[im 20/95  soft-tissue]
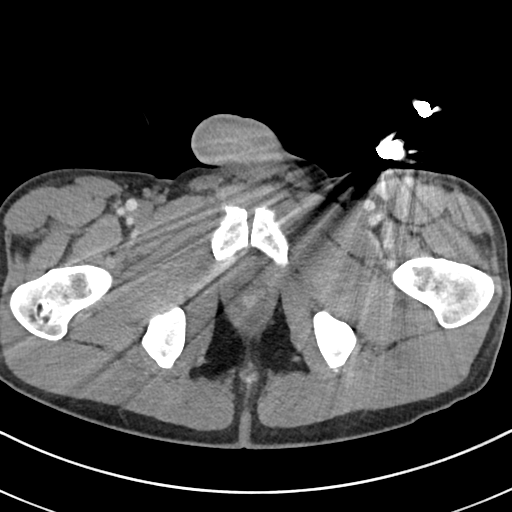
[im 25/95  soft-tissue]
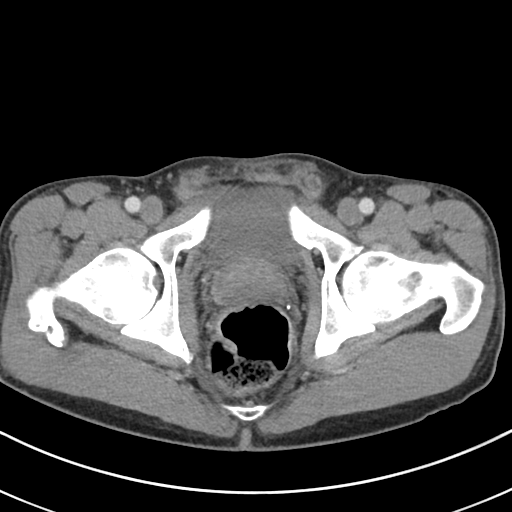
[im 35/95  soft-tissue]
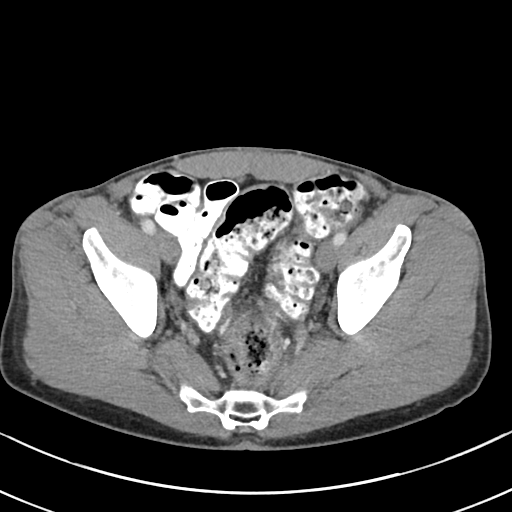
[im 40/95  soft-tissue]
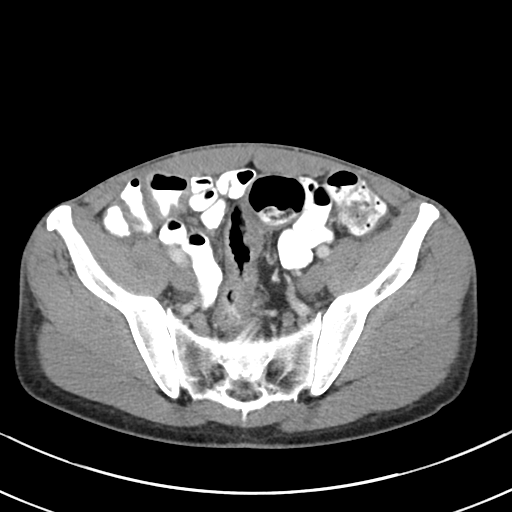
[im 50/95  soft-tissue]
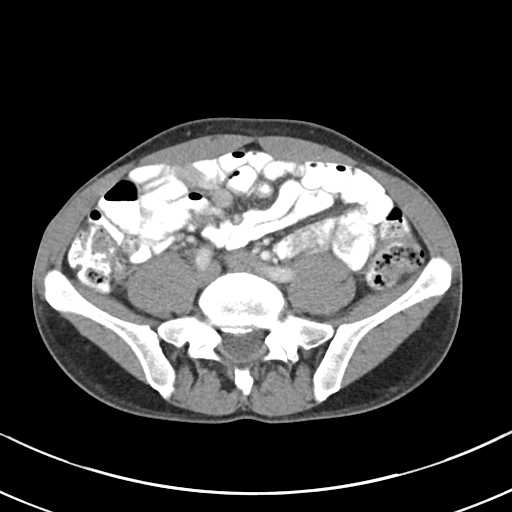
[im 55/95  soft-tissue]
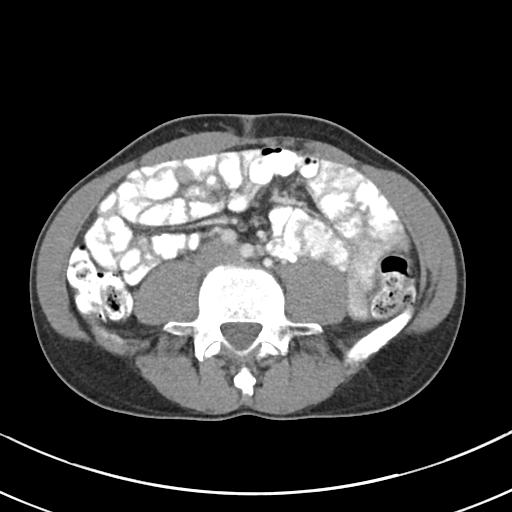
[im 60/95  soft-tissue]
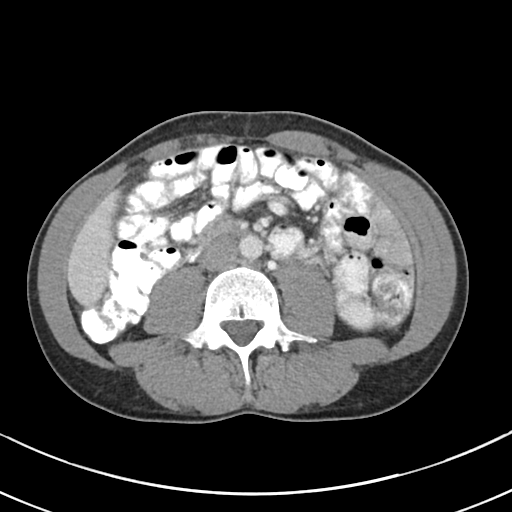
[im 60/95  bone]
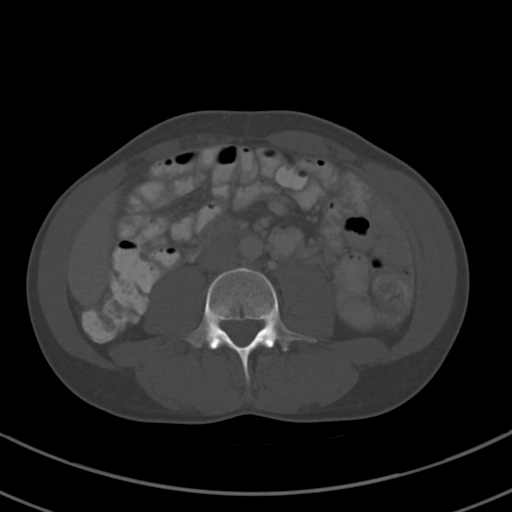
[im 70/95  soft-tissue]
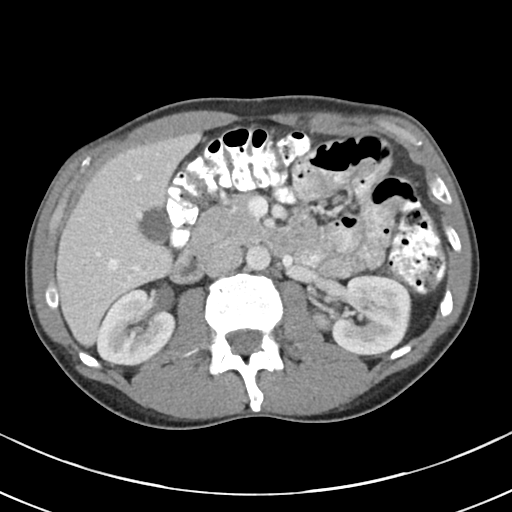
[im 75/95  soft-tissue]
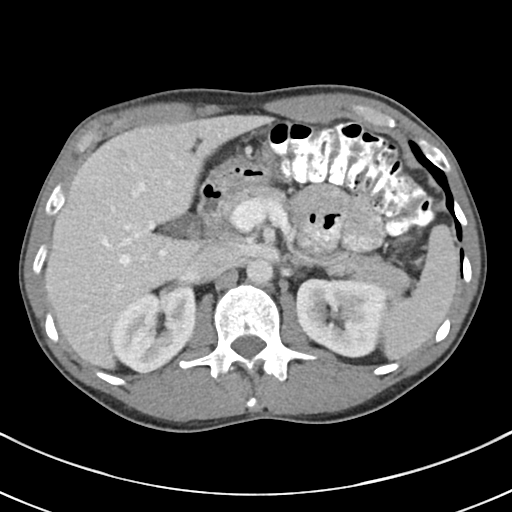
[im 80/95  soft-tissue]
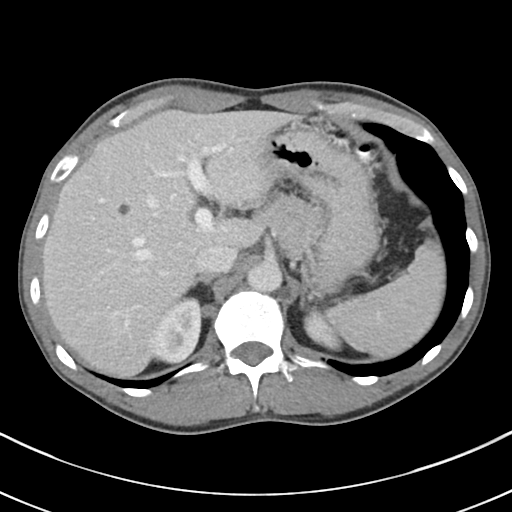
[im 90/95  soft-tissue]
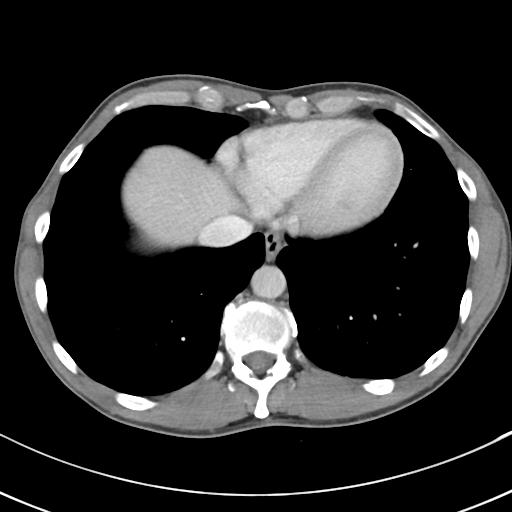

[Series 5: coronal · coronal · 0.71mm/px · 3 of 85 slices shown]
[im 29/85  soft-tissue]
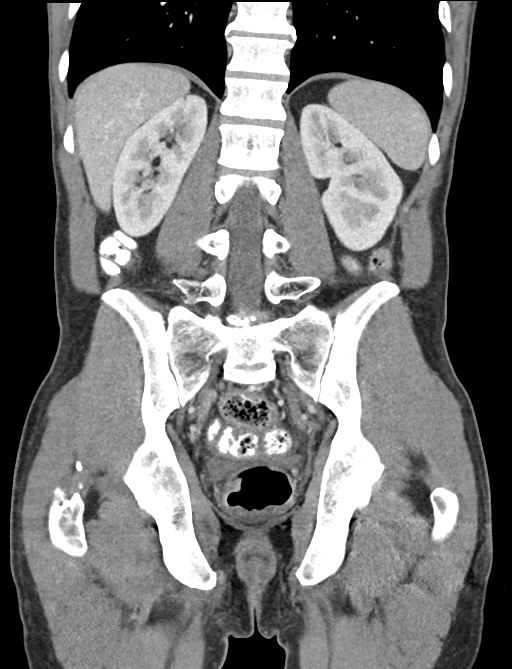
[im 38/85  soft-tissue]
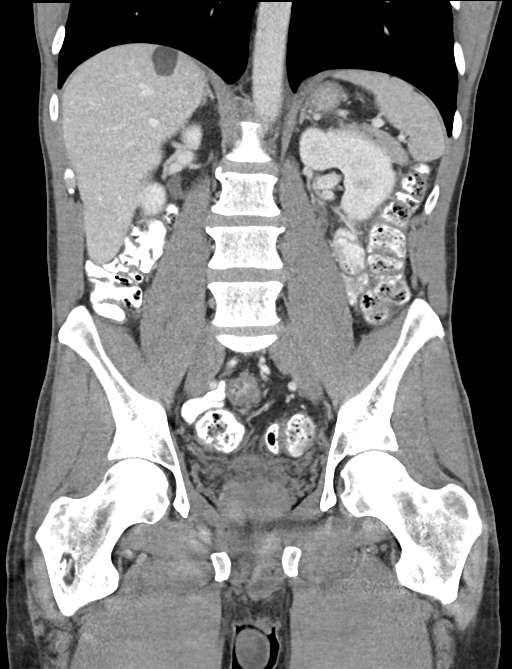
[im 47/85  soft-tissue]
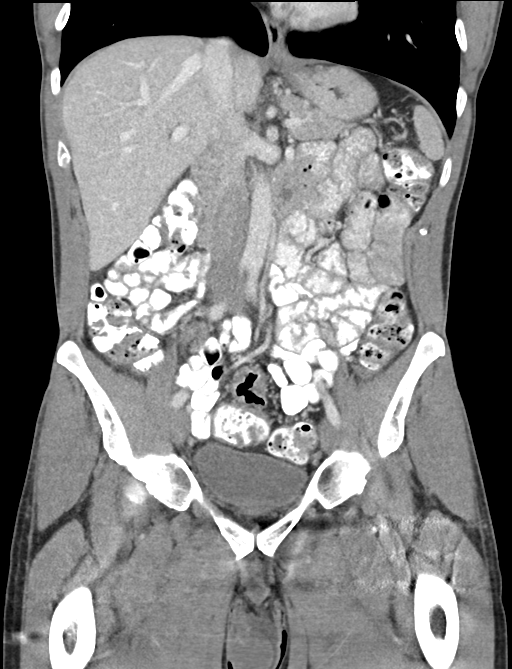

[16 of 46 positions shown; findings below may reference images not displayed]

RADIATION DOSE REDUCTION: This exam was performed according to the
departmental dose-optimization program which includes automated
exposure control, adjustment of the mA and/or kV according to
patient size and/or use of iterative reconstruction technique.

CONTRAST:  75mL OMNIPAQUE IOHEXOL 300 MG/ML  SOLN
FINDINGS: Lower Chest: No acute findings.

Hepatobiliary: No hepatic masses identified. A few small benign
hepatic cysts are seen, largest measuring 2 cm. Gallbladder is
unremarkable. No evidence of biliary ductal dilatation.

Pancreas:  No mass or inflammatory changes.

Spleen: Within normal limits in size and appearance.

Adrenals/Urinary Tract: No masses identified. Tiny sub-cm left renal
cyst noted. No evidence of ureteral calculi or hydronephrosis.

Stomach/Bowel: No evidence of obstruction, inflammatory process or
abnormal fluid collections.

Vascular/Lymphatic: No pathologically enlarged lymph nodes. No acute
vascular findings.

Reproductive:  No mass or other significant abnormality.

Other:  None.

Musculoskeletal:  No suspicious bone lesions identified.
IMPRESSION: No acute findings or other significant abnormality.

## 2023-11-28 ENCOUNTER — Ambulatory Visit: Payer: Self-pay | Admitting: Physician Assistant

## 2023-11-28 NOTE — Progress Notes (Deleted)
 11/28/2023 Nicholas Townsend [id number] Apr 09, 1966  Referring provider: Verlene Glimpse, NP Primary GI doctor: Dr. Dominic Friendly  ASSESSMENT AND PLAN:  Abdominal bloating and change in bowel habits Started after treatment for H. pylori 2023 negative celiac, C. difficile, fecal calprotectin 20 11/30/2020 colonoscopy for surveillance unremarkable recall 5 years no specimens collected  Dyspepsia and history of H. Pylori EGD 08/02/2021 normal except for some mild patchy antral erosions and erythema, biopsies positive for H. pylori. This was treated with bismuth  based quadruple therapy.  Eradication study - 09/04/2021  Personal history of adenomatous polyps  family history of CRC in father 11/30/2020 colonoscopy for surveillance unremarkable recall 5 years no specimens collected 11/30/2025  Patient Care Team: Verlene Glimpse, NP as PCP - General  HISTORY OF PRESENT ILLNESS: 58 y.o. male with a past medical history listed below presents for evaluation of abdominal pain and excessive gas.   Last seen 09/01/2021 by Dr. Dominic Friendly for dyspepsia and history of H. pylori infection.  *** Discussed the use of AI scribe software for clinical note transcription with the patient, who gave verbal consent to proceed.  History of Present Illness            He  reports that he has never smoked. He has never used smokeless tobacco. He reports current alcohol use. He reports that he does not use drugs.  RELEVANT GI HISTORY, IMAGING AND LABS: Results          CBC    Component Value Date/Time   WBC 4.3 09/07/2021 1422   RBC 4.50 09/07/2021 1422   HGB 13.7 09/07/2021 1422   HCT 41.3 09/07/2021 1422   PLT 250.0 09/07/2021 1422   MCV 91.7 09/07/2021 1422   MCH 31.5 11/17/2018 1349   MCHC 33.2 09/07/2021 1422   RDW 13.5 09/07/2021 1422   LYMPHSABS 0.9 04/13/2010 0956   MONOABS 0.3 04/13/2010 0956   EOSABS 0.1 04/13/2010 0956   BASOSABS 0.0 04/13/2010 0956   No results for input(s): "HGB" in  the last 8760 hours.  CMP     Component Value Date/Time   NA 140 09/07/2021 1422   K 4.4 09/07/2021 1422   CL 103 09/07/2021 1422   CO2 29 09/07/2021 1422   GLUCOSE 88 09/07/2021 1422   BUN 19 09/07/2021 1422   CREATININE 1.21 09/07/2021 1422   CALCIUM 10.0 09/07/2021 1422   PROT 6.8 09/07/2021 1422   ALBUMIN 4.7 09/07/2021 1422   AST 20 09/07/2021 1422   ALT 22 09/07/2021 1422   ALKPHOS 49 09/07/2021 1422   BILITOT 0.6 09/07/2021 1422   GFRNONAA >60 11/17/2018 1349   GFRAA >60 11/17/2018 1349      Latest Ref Rng & Units 09/07/2021    2:22 PM 04/13/2010    9:56 AM  Hepatic Function  Total Protein 6.0 - 8.3 g/dL 6.8  6.3   Albumin 3.5 - 5.2 g/dL 4.7  3.9   AST 0 - 37 U/L 20  28   ALT 0 - 53 U/L 22  29   Alk Phosphatase 39 - 117 U/L 49  63   Total Bilirubin 0.2 - 1.2 mg/dL 0.6  0.6   Bilirubin, Direct 0.0 - 0.3 mg/dL  0.1       Current Medications:        Current Outpatient Medications (Other):    dicyclomine  (BENTYL ) 10 MG capsule, Take 1 capsule (10 mg total) by mouth 3 times daily before meals and at bedtime for 2 days.  Then take 1 capsule (10 mg total) by mouth twice a day.   LORazepam (ATIVAN) 0.5 MG tablet, Take 0.5 mg by mouth at bedtime as needed for anxiety.   Multiple Vitamin (MULTIVITAMIN WITH MINERALS) TABS tablet, Take 1 tablet by mouth daily.  Medical History:  Past Medical History:  Diagnosis Date   Anxiety    Blood transfusion without reported diagnosis    Esophageal reflux    Family history of malignant neoplasm of gastrointestinal tract    H. pylori infection    Hiatal hernia    IBS (irritable bowel syndrome)    Allergies:  Allergies  Allergen Reactions   Codeine Phosphate     REACTION: GI upset     Surgical History:  He  has a past surgical history that includes Femur fracture surgery; Wisdom tooth extraction; Colonoscopy (2017); and Esophagogastroduodenoscopy. Family History:  His family history includes Breast cancer in his  sister; Colon cancer in his cousin, paternal grandmother, and paternal uncle; Colon cancer (age of onset: 32) in his father; Hypertension in his father and mother; Skin cancer in his father.  REVIEW OF SYSTEMS  : All other systems reviewed and negative except where noted in the History of Present Illness.  PHYSICAL EXAM: There were no vitals taken for this visit. Physical Exam          Edmonia Gottron, PA-C 7:54 AM

## 2023-12-23 NOTE — Progress Notes (Deleted)
 12/23/2023 Nicholas Townsend [id number] 07/02/1965  Referring provider: Cristopher Suzen HERO, NP Primary GI doctor: Dr. Legrand  ASSESSMENT AND PLAN:  Abdominal bloating and change in bowel habits Started after treatment for H. pylori 2023 negative celiac, C. difficile, fecal calprotectin 20 11/30/2020 colonoscopy for surveillance unremarkable recall 5 years no specimens collected  Dyspepsia and history of H. Pylori EGD 08/02/2021 normal except for some mild patchy antral erosions and erythema, biopsies positive for H. pylori. This was treated with bismuth  based quadruple therapy.  Eradication study negative 09/04/2021  Personal history of adenomatous polyps  family history of CRC in father 11/30/2020 colonoscopy for surveillance unremarkable recall 5 years no specimens collected Recall 11/30/2025  Patient Care Team: Cristopher Suzen HERO, NP as PCP - General  HISTORY OF PRESENT ILLNESS: 58 y.o. male with a past medical history listed below presents for evaluation of abdominal pain and excessive gas.   Last seen 09/01/2021 by Dr. Legrand for dyspepsia and history of H. pylori infection.  *** Discussed the use of AI scribe software for clinical note transcription with the patient, who gave verbal consent to proceed.  History of Present Illness            He  reports that he has never smoked. He has never used smokeless tobacco. He reports current alcohol use. He reports that he does not use drugs.  RELEVANT GI HISTORY, IMAGING AND LABS: Results          CBC    Component Value Date/Time   WBC 4.3 09/07/2021 1422   RBC 4.50 09/07/2021 1422   HGB 13.7 09/07/2021 1422   HCT 41.3 09/07/2021 1422   PLT 250.0 09/07/2021 1422   MCV 91.7 09/07/2021 1422   MCH 31.5 11/17/2018 1349   MCHC 33.2 09/07/2021 1422   RDW 13.5 09/07/2021 1422   LYMPHSABS 0.9 04/13/2010 0956   MONOABS 0.3 04/13/2010 0956   EOSABS 0.1 04/13/2010 0956   BASOSABS 0.0 04/13/2010 0956   No results for  input(s): HGB in the last 8760 hours.  CMP     Component Value Date/Time   NA 140 09/07/2021 1422   K 4.4 09/07/2021 1422   CL 103 09/07/2021 1422   CO2 29 09/07/2021 1422   GLUCOSE 88 09/07/2021 1422   BUN 19 09/07/2021 1422   CREATININE 1.21 09/07/2021 1422   CALCIUM 10.0 09/07/2021 1422   PROT 6.8 09/07/2021 1422   ALBUMIN 4.7 09/07/2021 1422   AST 20 09/07/2021 1422   ALT 22 09/07/2021 1422   ALKPHOS 49 09/07/2021 1422   BILITOT 0.6 09/07/2021 1422   GFRNONAA >60 11/17/2018 1349   GFRAA >60 11/17/2018 1349      Latest Ref Rng & Units 09/07/2021    2:22 PM 04/13/2010    9:56 AM  Hepatic Function  Total Protein 6.0 - 8.3 g/dL 6.8  6.3   Albumin 3.5 - 5.2 g/dL 4.7  3.9   AST 0 - 37 U/L 20  28   ALT 0 - 53 U/L 22  29   Alk Phosphatase 39 - 117 U/L 49  63   Total Bilirubin 0.2 - 1.2 mg/dL 0.6  0.6   Bilirubin, Direct 0.0 - 0.3 mg/dL  0.1       Current Medications:        Current Outpatient Medications (Other):    dicyclomine  (BENTYL ) 10 MG capsule, Take 1 capsule (10 mg total) by mouth 3 times daily before meals and at bedtime for 2  days. Then take 1 capsule (10 mg total) by mouth twice a day.   LORazepam (ATIVAN) 0.5 MG tablet, Take 0.5 mg by mouth at bedtime as needed for anxiety.   Multiple Vitamin (MULTIVITAMIN WITH MINERALS) TABS tablet, Take 1 tablet by mouth daily.  Medical History:  Past Medical History:  Diagnosis Date   Anxiety    Blood transfusion without reported diagnosis    Esophageal reflux    Family history of malignant neoplasm of gastrointestinal tract    H. pylori infection    Hiatal hernia    IBS (irritable bowel syndrome)    Allergies:  Allergies  Allergen Reactions   Codeine Phosphate     REACTION: GI upset     Surgical History:  He  has a past surgical history that includes Femur fracture surgery; Wisdom tooth extraction; Colonoscopy (2017); and Esophagogastroduodenoscopy. Family History:  His family history includes Breast  cancer in his sister; Colon cancer in his cousin, paternal grandmother, and paternal uncle; Colon cancer (age of onset: 44) in his father; Hypertension in his father and mother; Skin cancer in his father.  REVIEW OF SYSTEMS  : All other systems reviewed and negative except where noted in the History of Present Illness.  PHYSICAL EXAM: There were no vitals taken for this visit. Physical Exam          Alan JONELLE Coombs, PA-C 1:27 PM

## 2023-12-24 ENCOUNTER — Ambulatory Visit: Payer: Self-pay | Admitting: Physician Assistant

## 2024-03-04 ENCOUNTER — Encounter: Payer: Self-pay | Admitting: Gastroenterology

## 2024-03-04 ENCOUNTER — Ambulatory Visit: Payer: Self-pay | Admitting: Gastroenterology

## 2024-03-04 VITALS — BP 108/70 | HR 65 | Ht 69.0 in | Wt 164.6 lb

## 2024-03-04 DIAGNOSIS — R1084 Generalized abdominal pain: Secondary | ICD-10-CM | POA: Diagnosis not present

## 2024-03-04 DIAGNOSIS — K219 Gastro-esophageal reflux disease without esophagitis: Secondary | ICD-10-CM

## 2024-03-04 DIAGNOSIS — R110 Nausea: Secondary | ICD-10-CM | POA: Diagnosis not present

## 2024-03-04 DIAGNOSIS — Z8619 Personal history of other infectious and parasitic diseases: Secondary | ICD-10-CM | POA: Diagnosis not present

## 2024-03-04 MED ORDER — FAMOTIDINE 20 MG PO TABS: 20.0000 mg | ORAL_TABLET | Freq: Two times a day (BID) | ORAL | 3 refills | Status: DC

## 2024-03-04 NOTE — Progress Notes (Signed)
 03/04/2024 ISSIAH HUFFAKER [id number] 07-21-1965   HISTORY OF PRESENT ILLNESS: This is a 58 year old male who is a patient of Dr. Clayburn.  Has history of H. pylori seen on endoscopy in February 2023.  That was treated with Flagyl , bismuth , doxycycline , PPI as below.  He is here today after a 2-1/2-year hiatus with some recurrent upper GI symptoms.  He says he just has upper GI discomfort, sour stomach, indigestion, some nausea.  Says that he just feels yucky in his upper abdomen after eating.  Says he feels similar to when he was diagnosed with H. pylori.  Takes Tums as needed for heartburn and reflux.  Somewhat opposed to taking PPI therapy.  EGD 07/2021:  - Normal esophagus. - Erosive gastropathy with no bleeding and no stigmata of recent bleeding. - Normal examined duodenum. - Several biopsies were obtained in the gastric body and in the gastric antrum.  Surgical [P], random gastric MODERATE CHRONIC, FOCALLY ACTIVE GASTRITIS WITH LYMPHOID AGGREGATES HELICOBACTER PRESENT NEGATIVE FOR INTESTINAL METAPLASIA, DYSPLASIA AND CARCINOMA  Hpylori treated with Flagyl , bismuth , doxy, PPI.  Colonoscopy 11/2020:  Normal.  Repeat in 5 years for family history of colon cancer in his father   Past Medical History:  Diagnosis Date   Anxiety    Blood transfusion without reported diagnosis    Esophageal reflux    Family history of malignant neoplasm of gastrointestinal tract    H. pylori infection    Hiatal hernia    IBS (irritable bowel syndrome)    Past Surgical History:  Procedure Laterality Date   COLONOSCOPY  2017   ESOPHAGOGASTRODUODENOSCOPY     FEMUR FRACTURE SURGERY     WISDOM TOOTH EXTRACTION      reports that he has never smoked. He has never used smokeless tobacco. He reports current alcohol use. He reports that he does not use drugs. family history includes Breast cancer in his sister; Colon cancer in his cousin, paternal grandmother, and paternal uncle; Colon cancer (age  of onset: 28) in his father; Hypertension in his father and mother; Skin cancer in his father. Allergies  Allergen Reactions   Codeine Phosphate     REACTION: GI upset      Outpatient Encounter Medications as of 03/04/2024  Medication Sig   calcium carbonate (TUMS - DOSED IN MG ELEMENTAL CALCIUM) 500 MG chewable tablet Chew 1 tablet by mouth as needed for indigestion or heartburn.   Multiple Vitamin (MULTIVITAMIN WITH MINERALS) TABS tablet Take 1 tablet by mouth daily.   [DISCONTINUED] dicyclomine  (BENTYL ) 10 MG capsule Take 1 capsule (10 mg total) by mouth 3 times daily before meals and at bedtime for 2 days. Then take 1 capsule (10 mg total) by mouth twice a day.   [DISCONTINUED] LORazepam (ATIVAN) 0.5 MG tablet Take 0.5 mg by mouth at bedtime as needed for anxiety.   No facility-administered encounter medications on file as of 03/04/2024.    REVIEW OF SYSTEMS  : All other systems reviewed and negative except where noted in the History of Present Illness.   PHYSICAL EXAM: BP 108/70   Pulse 65   Ht 5' 9 (1.753 m)   Wt 164 lb 9.6 oz (74.7 kg)   BMI 24.31 kg/m  General: Well developed white male in no acute distress Head: Normocephalic and atraumatic Eyes:  Sclerae anicteric, conjunctiva pink. Ears: Normal auditory acuity Lungs: Clear throughout to auscultation; no W/R/R. Heart: Regular rate and rhythm; no M/R/G. Abdomen: Soft, non-distended.  BS present.  Non-tender.  Musculoskeletal: Symmetrical with no gross deformities  Skin: No lesions on visible extremities Extremities: No edema  Neurological: Alert oriented x 4, grossly non-focal Psychological:  Alert and cooperative. Normal mood and affect  ASSESSMENT AND PLAN: 58 year old male with history of H. pylori and GERD.  Hpylori as successfully treated with Flagyl /Doxy/bismuth /PPI and follow-up stool study negative.  Describes recurrence of symptoms such as abdominal discomfort, sour stomach, nausea, occasional GERD.  Only  using Tums as needed.  He says overall he just has a yucky feeling in his abdomen.  Feels similar to when he had H. pylori before.  Explained to him I think the recurrence of H. pylori is very unlikely, but nonetheless we will perform an H. pylori Diatherix.  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  He does not really not want to take PPI therapy, but is agreeable to try famotidine  20 mg twice daily for the next 4 to 6 weeks to see if this helps his symptoms.  Prescription sent to pharmacy.  Otherwise could consider trial of buspirone, mirtazapine, amitriptyline for functional symptoms.   CC:  Cristopher Suzen HERO, NP

## 2024-03-04 NOTE — Patient Instructions (Signed)
 We have sent the following medications to your pharmacy for you to pick up at your convenience: Famotidine  20 mg twice daily.  Your provider has ordered Diatherix stool testing for you. You have received a kit from our office today containing all necessary supplies to complete this test. Please carefully read the stool collection instructions provided in the kit before opening the accompanying materials. In addition, be sure there is a label providing your full name and date of birth on the puritan opti-swab tube that is supplied in the kit (if you do not see a label with this information on your test tube, please make us  aware before test collection!). After completing the test, you should secure the purtian tube into the specimen biohazard bag. The [facility] E-Req sheet (including date and time of specimen collection) should be placed into the outside pocket of the specimen biohazard bag and returned to the [place] lab (basement floor of [facility]) within 3 days of collection. Please make sure to give the specimen to a staff member at the lab. DO NOT leave the specimen on the counter.   If the specimen date and time (can be found in the upper right boxed portion of the sheet) are not filled out on the E-Req sheet, the test will NOT be performed.   _______________________________________________________  If your blood pressure at your visit was 140/90 or greater, please contact your primary care physician to follow up on this.  _______________________________________________________  If you are age 59 or older, your body mass index should be between 23-30. Your Body mass index is 24.31 kg/m. If this is out of the aforementioned range listed, please consider follow up with your Primary Care Provider.  If you are age 36 or younger, your body mass index should be between 19-25. Your Body mass index is 24.31 kg/m. If this is out of the aformentioned range listed,  please consider follow up with your Primary Care Provider.   ________________________________________________________  The [place] [facility] providers would like to encourage you to use MYCHART to communicate with providers for non-urgent requests or questions.  Due to long hold times on the telephone, sending your provider a message by [facility] may be a faster and more efficient way to get a response.  Please allow 48 business hours for a response.  Please remember that this is for non-urgent requests.  _______________________________________________________  [facility] is using a team-based approach to care.  Your team is made up of your doctor and two to three APPS. Our APPS (Nurse Practitioners and Physician Assistants) work with your physician to ensure care continuity for you. They are fully qualified to address your health concerns and develop a treatment plan. They communicate directly with your gastroenterologist to care for you. Seeing the Advanced Practice Practitioners on your physician's team can help you by facilitating care more promptly, often allowing for earlier appointments, access to diagnostic testing, procedures, and other specialty referrals.

## 2024-03-05 NOTE — Progress Notes (Signed)
 ____________________________________________________________  Attending physician addendum:  Thank you for sending this case to me. I have reviewed the entire note and agree with the plan.  Please ensure that he has some follow-up scheduled so that if he has persistent symptoms despite trial of acid suppression we can consider imaging and/or endoscopy.  Victory Brand, MD  ____________________________________________________________

## 2024-03-16 ENCOUNTER — Telehealth: Payer: Self-pay | Admitting: *Deleted

## 2024-03-16 NOTE — Telephone Encounter (Signed)
 Left message for patient to call office. Patient scheduled for follow up on Monday 05/18/24 @ 9:40 am.

## 2024-03-16 NOTE — Telephone Encounter (Signed)
-----   Message from [place] D. Zehr sent at 03/06/2024  5:52 PM EDT ----- Please get him a follow-up with Dr. Legrand in 8 to 10 weeks.

## 2024-03-16 NOTE — Telephone Encounter (Signed)
Patient returning call, Please advise

## 2024-03-20 NOTE — Telephone Encounter (Signed)
 Inbound call from patient wanting to speak to [facility]  Requesting a call back  Please advise  Thank you

## 2024-03-23 NOTE — Telephone Encounter (Signed)
 Left message for patient to call office.

## 2024-03-23 NOTE — Telephone Encounter (Signed)
 Delayed post 03/20/24 - Informed patient of follow up with Dr. Legrand. Per patient Famotidine  was not helpful and he discontinued medication. Patient requested results of his H. Pylori test.

## 2024-03-23 NOTE — Telephone Encounter (Signed)
 Per [name], Nicholas Townsend - H. Pylori stool study was negative. Patient informed.

## 2024-04-10 ENCOUNTER — Encounter: Payer: Self-pay | Admitting: Gastroenterology

## 2024-05-18 ENCOUNTER — Encounter: Payer: Self-pay | Admitting: Gastroenterology

## 2024-05-18 ENCOUNTER — Ambulatory Visit (INDEPENDENT_AMBULATORY_CARE_PROVIDER_SITE_OTHER): Admitting: Gastroenterology

## 2024-05-18 VITALS — BP 126/70 | HR 64 | Ht 69.0 in | Wt 165.0 lb

## 2024-05-18 DIAGNOSIS — K219 Gastro-esophageal reflux disease without esophagitis: Secondary | ICD-10-CM

## 2024-05-18 DIAGNOSIS — Z8619 Personal history of other infectious and parasitic diseases: Secondary | ICD-10-CM | POA: Diagnosis not present

## 2024-05-18 MED ORDER — PANTOPRAZOLE SODIUM 40 MG PO TBEC: 40.0000 mg | DELAYED_RELEASE_TABLET | Freq: Every day | ORAL | 2 refills | Status: AC

## 2024-05-18 NOTE — Patient Instructions (Addendum)
 You have been scheduled for an Upper GI Series at [facility]. Your appointment is on 06/04/24 at 9:00 am . Please arrive 30 minutes prior to your test for registration. Make sure not to eat or drink anything after midnight on the night before your test. If you need to reschedule, please call radiology at [phone]. ____________________________________________________________ An upper GI series uses x rays to help diagnose problems of the upper GI tract, which includes the esophagus, stomach, and duodenum. The duodenum is the first part of the small intestine. An upper GI series is conducted by a radiology technologist or a radiologist--a doctor who specializes in x-ray imaging--at a hospital or outpatient center. While sitting or standing in front of an x-ray machine, the patient drinks barium liquid, which is often white and has a chalky consistency and taste. The barium liquid coats the lining of the upper GI tract and makes signs of disease show up more clearly on x rays. X-ray video, called fluoroscopy, is used to view the barium liquid moving through the esophagus, stomach, and duodenum. Additional x rays and fluoroscopy are performed while the patient lies on an x-ray table. To fully coat the upper GI tract with barium liquid, the technologist or radiologist may press on the abdomen or ask the patient to change position. Patients hold still in various positions, allowing the technologist or radiologist to take x rays of the upper GI tract at different angles. If a technologist conducts the upper GI series, a radiologist will later examine the images to look for problems.  This test typically takes about 1 hour to complete. ____________________________________________________________  We have sent the following medications to your pharmacy for you to pick up at your convenience: Pantoprazole    Stop Pepcid  for now and start Pantoprazole  40 mg once daily.   Follow-up with Dr Legrand on: 07/24/24  at 9:20 am.   _______________________________________________________  If your blood pressure at your visit was 140/90 or greater, please contact your primary care physician to follow up on this.  _______________________________________________________  If you are age 74 or older, your body mass index should be between 23-30. Your Body mass index is 24.37 kg/m. If this is out of the aforementioned range listed, please consider follow up with your Primary Care Provider.  If you are age 17 or younger, your body mass index should be between 19-25. Your Body mass index is 24.37 kg/m. If this is out of the aformentioned range listed, please consider follow up with your Primary Care Provider.   ________________________________________________________  The [place] [facility] providers would like to encourage you to use MYCHART to communicate with providers for non-urgent requests or questions.  Due to long hold times on the telephone, sending your provider a message by [facility] may be a faster and more efficient way to get a response.  Please allow 48 business hours for a response.  Please remember that this is for non-urgent requests.  _______________________________________________________  [facility] is using a team-based approach to care.  Your team is made up of your doctor and two to three APPS. Our APPS (Nurse Practitioners and Physician Assistants) work with your physician to ensure care continuity for you. They are fully qualified to address your health concerns and develop a treatment plan. They communicate directly with your gastroenterologist to care for you. Seeing the Advanced Practice Practitioners on your physician's team can help you by facilitating care more promptly, often allowing for earlier appointments, access to diagnostic testing, procedures, and other specialty referrals.  Thank you for choosing me and [facility].  Dr. Legrand

## 2024-05-18 NOTE — Progress Notes (Signed)
  GI Progress Note  Chief Complaint: Dyspepsia and GERD  Subjective  Prior history  EGD 07/2021:   - Normal esophagus. - Erosive gastropathy with no bleeding and no stigmata of recent bleeding. - Normal examined duodenum. - Several biopsies were obtained in the gastric body and in the gastric antrum.   Surgical [P], random gastric MODERATE CHRONIC, FOCALLY ACTIVE GASTRITIS WITH LYMPHOID AGGREGATES HELICOBACTER PRESENT NEGATIVE FOR INTESTINAL METAPLASIA, DYSPLASIA AND CARCINOMA   Hpylori treated with Flagyl , bismuth , doxy, PPI.      Follow-up testing confirmed eradication.   Colonoscopy 11/2020:  Normal.  Repeat in 5 years for family history of colon cancer in his father ______________________  Clinic visit with APP September 2025 for persistent dyspepsia (described in that note) Retested Diatherix H. pylori (negative).  Retrial of acid suppression offered, patient preferred H2 blocker over PPI  Discussed the use of AI scribe software for clinical note transcription with the patient, who gave verbal consent to proceed.  History of Present Illness Nicholas Townsend is a 58 year old male with a history of H. pylori infection and hiatal hernia who presents with persistent gastroesophageal reflux symptoms.  Gastroesophageal reflux symptoms - Persistent gastroesophageal reflux symptoms including upper abdominal/lower sternal pain, acid reflux, and heartburn - Pain predominantly in the upper abdomen, mainly in the morning upon waking - Sensation of pressure in the chest and scratchy throat, especially in the mornings - Scratchy throat worsens with singing in church - Reflux symptoms persist despite lifestyle modifications and medication changes  Hiatal hernia and associated symptoms - History of small hiatal hernia - Hiatal hernia causes pressure symptoms, exacerbated by spicy food and stress - Occasional sensation of pressure with certain foods or vitamin supplements -  Denies dysphagia or odynophagia   Medication history and response - Initial use of Prilosec without symptom relief - Current use of Pepcid  twice daily, which has helped reduce heartburn - Previous use of Prolisac, discontinued due to lack of efficacy  Lifestyle modifications - Significant reduction in coffee intake from three espressos and two cups of coffee daily (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 Past Medical History:  Diagnosis Date   Anxiety    Blood transfusion without reported diagnosis    Esophageal reflux    Family history of malignant neoplasm of gastrointestinal tract    H. pylori infection    Hiatal hernia    IBS (irritable bowel syndrome)     Past Surgical History:  Procedure Laterality Date   COLONOSCOPY  2017   ESOPHAGOGASTRODUODENOSCOPY     FEMUR FRACTURE SURGERY     WISDOM TOOTH EXTRACTION       Objective:  Med list reviewed  Current Outpatient Medications:    calcium carbonate (TUMS - DOSED IN MG ELEMENTAL CALCIUM) 500 MG chewable tablet, Chew 1 tablet by mouth as needed for indigestion or heartburn., Disp: , Rfl:    pantoprazole  (PROTONIX ) 40 MG tablet, Take 1 tablet (40 mg total) by mouth daily., Disp: 30 tablet, Rfl: 2  Multiple Vitamin (MULTIVITAMIN WITH MINERALS) TABS tablet, Take 1 tablet by mouth daily. (Patient not taking: Reported on 05/18/2024), Disp: , Rfl:    Vital signs in last 24 hrs: Vitals:   05/18/24 0938  BP: 126/70  Pulse: 64   Wt Readings from Last 3  Encounters:  05/18/24 165 lb (74.8 kg)  03/04/24 164 lb 9.6 oz (74.7 kg)  09/01/21 157 lb (71.2 kg)    Physical Exam  Well-appearing-no additional exam Entire visit spent in review of results, symptoms and plan.   Labs:  Diatherix H. pylori - September 2025 ___________________________________________ Radiologic studies:  Last abdominal imaging was CT abdomen and pelvis March 2023 done for abdominal pain and altered bowel habits-normal study ____________________________________________ Other:   _____________________________________________   Encounter Diagnoses  Name Primary?   Gastroesophageal reflux disease without esophagitis Yes   History of Helicobacter pylori infection     Assessment & Plan Gastroesophageal reflux disease (GERD) Persistent GERD symptoms with partial relief from Pepcid . Discussed limitations of acid suppression and potential need for alternative treatments. Considered alternative PPIs and surgical options if symptoms persist. - Initiated pantoprazole  once daily for 6-8 weeks.  (Instead of famotidine ) - Ordered upper GI series to assess GERD - Will discuss potential for anti-reflux surgery or endoscopic fundoplication if symptoms persist.  Follow-up with me in about 2 months  Hiatal hernia Small hiatal hernia contributing to GERD symptoms. Discussed anatomical nature and potential need for surgical intervention if symptoms persist. - Will evaluate hiatal hernia size and dynamics with upper GI series.  Likely still small and sliding, as it was difficult to appreciate on EGD. - Will consider surgical repair or endoscopic fundoplication if indicated by upper GI series results.  Might be a candidate for TIF  Stressed antireflux diet and lifestyle measures including elevating head of bed and dietary triggers.  31 minutes were spent on this encounter (including chart review, history/exam, counseling/coordination of care, and documentation) > 50% of that time  was spent on counseling and coordination of care.   Nicholas Townsend

## 2024-06-04 ENCOUNTER — Ambulatory Visit (HOSPITAL_COMMUNITY)
Admission: RE | Admit: 2024-06-04 | Discharge: 2024-06-04 | Disposition: A | Discharge status: Home / Self Care | Source: Ambulatory Visit | Attending: Gastroenterology | Admitting: Gastroenterology

## 2024-06-04 ENCOUNTER — Ambulatory Visit: Payer: Self-pay | Admitting: Gastroenterology

## 2024-06-04 DIAGNOSIS — Z8619 Personal history of other infectious and parasitic diseases: Secondary | ICD-10-CM | POA: Insufficient documentation

## 2024-06-04 DIAGNOSIS — K219 Gastro-esophageal reflux disease without esophagitis: Secondary | ICD-10-CM | POA: Diagnosis present

## 2024-07-23 NOTE — Progress Notes (Unsigned)
 "     [facility] Progress Note  Chief Complaint: ***  Subjective  Prior history  EGD 07/2021:   - Normal esophagus. - Erosive gastropathy with no bleeding and no stigmata of recent bleeding. - Normal examined duodenum. - Several biopsies were obtained in the gastric body and in the gastric antrum.   Biopsies positive for Hpylori -  treated with Flagyl , bismuth , doxy, PPI.      Follow-up testing confirmed eradication.   Colonoscopy 11/2020:  Normal.  Repeat in 5 years for family history of colon cancer in his father ______________________   Clinic visit with APP September 2025 for persistent dyspepsia (described in that note) Retested [employer] H. pylori (negative).  Retrial of acid suppression offered, patient preferred H2 blocker over PPI  CT abdomen pelvis done in 2023 for dyspepsia and patient's concerns of father's pancreatic cancer history -no acute findings or significant abnormalities  Clinic visit November 2025 for persistent reflux symptoms.  Patient agreed to a trial of PPI rather than H2 blocker as well as an upper GI series to evaluate his previously described hiatal hernia  Discussed the use of AI scribe software for clinical note transcription with the patient, who gave verbal consent to proceed.  History of Present Illness     ROS: Cardiovascular:  no chest pain Respiratory: no dyspnea  The patient's Past Medical, Family and Social History were reviewed and are on file in the EMR. Past Medical History:  Diagnosis Date   Anxiety    Blood transfusion without reported diagnosis    Esophageal reflux    Family history of malignant neoplasm of gastrointestinal tract    H. pylori infection    Hiatal hernia    IBS (irritable bowel syndrome)     Past Surgical History:  Procedure Laterality Date   COLONOSCOPY  2017   ESOPHAGOGASTRODUODENOSCOPY     FEMUR FRACTURE SURGERY     WISDOM TOOTH EXTRACTION       Objective:  Med list reviewed Current  Medications[1]   Vital signs in last 24 hrs: There were no vitals filed for this visit. Wt Readings from Last 3 Encounters:  05/18/24 165 lb (74.8 kg)  03/04/24 164 lb 9.6 oz (74.7 kg)  09/01/21 157 lb (71.2 kg)    Physical Exam  *** HEENT: sclera anicteric, oral mucosa moist without lesions Neck: supple, no thyromegaly, JVD or lymphadenopathy Cardiac: ***,  no peripheral edema Pulm: clear to auscultation bilaterally, normal RR and effort noted Abdomen: soft, *** tenderness, with active bowel sounds. No guarding or palpable hepatosplenomegaly. Skin; warm and dry, no jaundice or rash   Labs:   ___________________________________________ Radiologic studies: FINDINGS:   KUB: Bowel gas pattern on the initial KUB is unremarkable.   OSSEOUS STRUCTURES: Mild thoracic spondylosis.   OROPHARYNX: Mildly prominent cricopharyngeus muscle. Flash laryngeal penetration noted on the lateral projection without frank tracheal aspiration.   ESOPHAGUS: Prior peristaltic waves in the esophagus were normal on all swallows. Small intermittent type 1 hiatal hernia. With valsalva maneuver there was reflux of contrast all the way up to the upper thoracic esophagus as shown on image 25 series 19. A 13 mm barium tablet passed briskly into the stomach. No significant esophageal stricture, narrowing, or luminal irregularity.   STOMACH: No gastric lesion identified.   IMPRESSION: 1. Small intermittent type 1 hiatal hernia. 2. Gastroesophageal reflux reaching the upper thoracic esophagus with Valsalva maneuver. 3. No significant esophageal stricture, narrowing, or luminal irregularity. 4. Mild thoracic spondylosis. 5. Flash laryngeal penetration  without frank tracheal aspiration.   Electronically signed by: Ryan Salvage MD 06/04/2024 01:53 PM EST RP Workstation: [id number]  ____________________________________________ Other:   _____________________________________________   No  diagnosis found.  Assessment and Plan Assessment & Plan      Plan:   I spent total of *** minutes in both face-to-face (*** minutes interview/exam) and non-face-to-face (*** minutes chart review, care coordination, documentation)  activities, excluding procedures performed, for the visit on the date of this encounter.   Victory LITTIE Brand III     [1]  Current Outpatient Medications:    calcium carbonate (TUMS - DOSED IN MG ELEMENTAL CALCIUM) 500 MG chewable tablet, Chew 1 tablet by mouth as needed for indigestion or heartburn., Disp: , Rfl:    Multiple Vitamin (MULTIVITAMIN WITH MINERALS) TABS tablet, Take 1 tablet by mouth daily. (Patient not taking: Reported on 05/18/2024), Disp: , Rfl:    pantoprazole  (PROTONIX ) 40 MG tablet, Take 1 tablet (40 mg total) by mouth daily., Disp: 30 tablet, Rfl: 2  "

## 2024-07-24 ENCOUNTER — Ambulatory Visit: Admitting: Gastroenterology

## 2024-07-30 ENCOUNTER — Ambulatory Visit: Admitting: Gastroenterology

## 2024-08-03 ENCOUNTER — Ambulatory Visit: Admitting: Gastroenterology
